# Patient Record
Sex: Female | Born: 1990 | Race: Black or African American | Hispanic: No | Marital: Single | Smoking: Current every day smoker
Health system: Southern US, Community
[De-identification: ages and names within clinical notes are randomized; demographics above are authoritative.]

## PROBLEM LIST (undated history)

## (undated) DIAGNOSIS — F419 Anxiety disorder, unspecified: Secondary | ICD-10-CM

## (undated) HISTORY — PX: WISDOM TOOTH EXTRACTION: SHX21

## (undated) HISTORY — PX: FINGER DEBRIDEMENT: SHX1634

## (undated) HISTORY — PX: BREAST CYST EXCISION: SHX579

## (undated) HISTORY — DX: Morbid (severe) obesity due to excess calories: E66.01

---

## 2015-07-25 ENCOUNTER — Encounter (HOSPITAL_COMMUNITY): Payer: Self-pay | Admitting: Emergency Medicine

## 2015-07-25 ENCOUNTER — Emergency Department (HOSPITAL_COMMUNITY)
Admission: EM | Admit: 2015-07-25 | Discharge: 2015-07-25 | Disposition: A | Discharge status: Home / Self Care | Payer: Self-pay | Attending: Emergency Medicine | Admitting: Emergency Medicine

## 2015-07-25 ENCOUNTER — Emergency Department (HOSPITAL_COMMUNITY): Payer: Self-pay

## 2015-07-25 DIAGNOSIS — J069 Acute upper respiratory infection, unspecified: Secondary | ICD-10-CM | POA: Insufficient documentation

## 2015-07-25 DIAGNOSIS — F17210 Nicotine dependence, cigarettes, uncomplicated: Secondary | ICD-10-CM | POA: Insufficient documentation

## 2015-07-25 DIAGNOSIS — K122 Cellulitis and abscess of mouth: Secondary | ICD-10-CM | POA: Insufficient documentation

## 2015-07-25 DIAGNOSIS — Z79899 Other long term (current) drug therapy: Secondary | ICD-10-CM | POA: Insufficient documentation

## 2015-07-25 MED ORDER — AMOXICILLIN 500 MG PO CAPS: 500.0000 mg | ORAL_CAPSULE | Freq: Three times a day (TID) | ORAL | Status: DC

## 2015-07-25 MED ORDER — ALBUTEROL SULFATE (2.5 MG/3ML) 0.083% IN NEBU
5.0000 mg | INHALATION_SOLUTION | Freq: Once | RESPIRATORY_TRACT | Status: AC
  Administered 2015-07-25: 5 mg via RESPIRATORY_TRACT
  Filled 2015-07-25: qty 6

## 2015-07-25 MED ORDER — IPRATROPIUM BROMIDE 0.02 % IN SOLN
0.5000 mg | Freq: Once | RESPIRATORY_TRACT | Status: AC
  Administered 2015-07-25: 0.5 mg via RESPIRATORY_TRACT
  Filled 2015-07-25: qty 2.5

## 2015-07-25 NOTE — ED Notes (Signed)
Patient reports having a cold for the last month.  Lists her symptoms as sinus congestion, wheezing, and coughing.  Patient in apparent distress at this time.

## 2015-07-25 NOTE — ED Provider Notes (Signed)
CSN: [id number]     Arrival date & time 07/25/15  0815 History   First MD Initiated Contact with Patient 07/25/15 0818     Chief Complaint  Patient presents with  . URI     (Consider location/radiation/quality/duration/timing/severity/associated sxs/prior Treatment) HPI   Patient to the ER with complaints of cold for 1 month. She has had sinus congestion, wheezing, coughing, current everyday smoker, with sore throat. Today she decided to come in because her mom recommended it when she was coughing more than normal and feels something swollen in her throat. She has also had hot flashes and cold chills. Per nursing note the patient is "in apparent distress at this time". However, she is resting comfortably in her bed without any sign of distress. NO SOB, drooling, angioedema, rash, respiratory distress. Pt talking in full sentences.   History reviewed. No pertinent past medical history. History reviewed. No pertinent past surgical history. No family history on file. Social History  Substance Use Topics  . Smoking status: Current Every Day Smoker -- 0.50 packs/day for 6 years    Types: Cigarettes  . Smokeless tobacco: None  . Alcohol Use: Yes     Comment: occasional   OB History    No data available     Review of Systems  ROS: See HPI Constitutional: no fever  Eyes: no drainage  ENT: no runny nose  Cardiovascular: no chest pain  Resp: no SOB  GI: no vomiting GU: no dysuria Integumentary: no rash  Allergy: no hives  Musculoskeletal: no leg swelling  Neurological: no slurred speech ROS otherwise negative  Allergies  Review of patient's allergies indicates no known allergies.  Home Medications   Prior to Admission medications   Medication Sig Start Date End Date Taking? Authorizing Provider  acetaminophen (TYLENOL) 500 MG tablet Take 1,000 mg by mouth every 6 (six) hours as needed for mild pain.    Yes Historical Provider, MD  Phenyleph-CPM-DM-APAP (TYLENOL  COLD MULTI-SYMPTOM) 12-29-08-325 MG MISC Take by mouth.   Yes Historical Provider, MD  amoxicillin (AMOXIL) 500 MG capsule Take 1 capsule (500 mg total) by mouth 3 (three) times daily. 07/25/15   Alyxander Kollmann Carlota Raspberry, PA-C   BP 103/82 mmHg  Pulse 106  Temp(Src) 98.9 F (37.2 C)  Resp 14  Ht 5\' 4"  (1.626 m)  Wt 104.327 kg  BMI 39.46 kg/m2  SpO2 100%  LMP 07/25/2015 Physical Exam  Constitutional: She appears well-developed and well-nourished. No distress.  HENT:  Head: Normocephalic and atraumatic.  Right Ear: Tympanic membrane and ear canal normal.  Left Ear: Tympanic membrane and ear canal normal.  Nose: Nose normal.  Mouth/Throat: Uvula is midline and oropharynx is clear and moist. No trismus in the jaw. Uvula swelling (erythema and swelling to uvula.) present. No dental abscesses.  Tongue or lip swelling.  Eyes: Pupils are equal, round, and reactive to light.  Neck: Normal range of motion. Neck supple.  Cardiovascular: Normal rate and regular rhythm.   Pulmonary/Chest: Effort normal. No accessory muscle usage. No tachypnea and no bradypnea. No respiratory distress. She has decreased breath sounds (to all lung fields). She has no wheezes. She has no rhonchi. She has no rales.  Abdominal: Soft. Bowel sounds are normal. There is no tenderness.  Neurological: She is alert.  Skin: Skin is warm and dry.  Psychiatric: Her mood appears not anxious. Her speech is not rapid and/or pressured. She does not exhibit a depressed mood.  Nursing note and vitals reviewed.   ED Course  Procedures (including critical care time) Labs Review Labs Reviewed - No data to display  Imaging Review Dg Neck Soft Tissue  07/25/2015  CLINICAL DATA:  Sore throat EXAM: NECK SOFT TISSUES - 1+ VIEW COMPARISON:  None. FINDINGS: There is no evidence of retropharyngeal soft tissue swelling or epiglottic enlargement. The cervical airway is unremarkable and no radio-opaque foreign body identified. IMPRESSION: Negative.  Electronically Signed   By: Franchot Gallo M.D.   On: 07/25/2015 09:06   Dg Chest 2 View  07/25/2015  CLINICAL DATA:  Sore throat, cough and congestion for 2 weeks. EXAM: CHEST  2 VIEW COMPARISON:  None. FINDINGS: Cardiomediastinal silhouette is normal. Mediastinal contours appear intact. There is no evidence of focal airspace consolidation, pleural effusion or pneumothorax. Osseous structures are without acute abnormality. Soft tissues are grossly normal. IMPRESSION: No active cardiopulmonary disease. Electronically Signed   By: Fidela Salisbury M.D.   On: 07/25/2015 09:06   I have personally reviewed and evaluated these images and lab results as part of my medical decision-making.   EKG Interpretation None      MDM   Final diagnoses:  Uvulitis  URI (upper respiratory infection)    Soft tissue neck and chest xrays are reassuring. Patient given a breathing treatment in the emergency department which helped open her airways. She will be given a prescription for amoxicillin for her uvulitis and a referral to ENT. Otherwise she can continue at home medication.  Medications  albuterol (PROVENTIL) (2.5 MG/3ML) 0.083% nebulizer solution 5 mg (5 mg Nebulization Given 07/25/15 0910)  ipratropium (ATROVENT) nebulizer solution 0.5 mg (0.5 mg Nebulization Given 07/25/15 0910)     I feel the patient has had an appropriate workup for their chief complaint at this time and likelihood of emergent condition existing is low. Discussed s/sx that warrant return to the ED.  Filed Vitals:   07/25/15 0823  BP: 103/82  Pulse: 106  Temp: 98.9 F (37.2 C)  Resp: [address] PA-C 07/25/15 [id number]  Dorie Rank, MD 07/25/15 [phone]

## 2015-07-25 NOTE — Discharge Instructions (Signed)
Uvulitis Uvulitis is infection or inflammation of the uvula. The uvula is the small, finger-like piece of tissue that hangs down at the back of your throat. CAUSES This condition may be caused by:  An infection in the mouth or throat. This is the most common cause.  Trauma to the uvula. Causes of trauma include burning your mouth and heavy snoring.  Fluid build-up (edema). Edema can be triggered be an allergic reaction. Uvulitis that is caused by edema is called Quincke disease.  Inhaling irritants, such as chemical agents, smoke, or steam. SYMPTOMS Symptoms of this condition depend on the cause.  Symptoms of uvulitis that is caused by infection include:  Red, swollen uvula.  Sore throat.  Fever.  Headache.  Swollen neck glands. Symptoms of uvulitis that is caused by trauma, edema, or irritation include:  Red, swollen uvula.  Sore throat.  Trouble swallowing.  Choking or gagging.  Trouble breathing. DIAGNOSIS This condition is diagnosed with a physical exam. You also may have tests, such as a throat culture and blood tests. TREATMENT Treatment for this condition depends on the cause. Treatment may involve:  Antibiotic medicine. Antibiotics may be prescribed if a bacterial infection is the cause.  Steroid medicine. Steroids may be given if edema is the cause.  Surgery to remove part of the uvula (partial uvulectomy). HOME CARE INSTRUCTIONS  Rest as much as possible until your condition improves.  Drink enough fluid to keep your urine clear or pale yellow.  Take over-the-counter and prescription medicines only as told by your health care provider.  If you were prescribed an antibiotic medicine, take it as told by your health care provider. Do not stop taking the antibiotic even if you start to feel better.  Use a cool-mist humidifier to ease irritation in your throat.  While your throat is sore:  Eat soft foods or drink liquids, such as soup.  Gargle with a  salt-water mixture 3-4 times per day or as needed. To make a salt-water mixture, completely dissolve -1 tsp of salt in 1 cup of warm water.  Keep all follow-up visits as told by your health care provider. This is important. SEEK MEDICAL CARE IF:  You have a fever.  You have trouble eating.  Your symptoms do not get better.  Your symptoms come back after treatment. SEEK IMMEDIATE MEDICAL CARE IF:  You have trouble breathing.  You have trouble swallowing.   This information is not intended to replace advice given to you by your health care provider. Make sure you discuss any questions you have with your health care provider.   Document Released: 03/26/2004 Document Revised: 05/07/2015 Document Reviewed: 11/06/2014 Elsevier Interactive Patient Education 2016 Else[employer]  Upper Respiratory Infection, Adult Most upper respiratory infections (URIs) are caused by a virus. A URI affects the nose, throat, and upper air passages. The most common type of URI is often called "the common cold." HOME CARE   Take medicines only as told by your doctor.  Gargle warm saltwater or take cough drops to comfort your throat as told by your doctor.  Use a warm mist humidifier or inhale steam from a shower to increase air moisture. This may make it easier to breathe.  Drink enough fluid to keep your pee (urine) clear or pale yellow.  Eat soups and other clear broths.  Have a healthy diet.  Rest as needed.  Go back to work when your fever is gone or your doctor says it is okay.  You may  need to stay home longer to avoid giving your URI to others.  You can also wear a face mask and wash your hands often to prevent spread of the virus.  Use your inhaler more if you have asthma.  Do not use any tobacco products, including cigarettes, chewing tobacco, or electronic cigarettes. If you need help quitting, ask your doctor. GET HELP IF:  You are getting worse, not better.  Your symptoms are  not helped by medicine.  You have chills.  You are getting more short of breath.  You have brown or red mucus.  You have yellow or brown discharge from your nose.  You have pain in your face, especially when you bend forward.  You have a fever.  You have puffy (swollen) neck glands.  You have pain while swallowing.  You have white areas in the back of your throat. GET HELP RIGHT AWAY IF:   You have very bad or constant:  Headache.  Ear pain.  Pain in your forehead, behind your eyes, and over your cheekbones (sinus pain).  Chest pain.  You have long-lasting (chronic) lung disease and any of the following:  Wheezing.  Long-lasting cough.  Coughing up blood.  A change in your usual mucus.  You have a stiff neck.  You have changes in your:  Vision.  Hearing.  Thinking.  Mood. MAKE SURE YOU:   Understand these instructions.  Will watch your condition.  Will get help right away if you are not doing well or get worse.   This information is not intended to replace advice given to you by your health care provider. Make sure you discuss any questions you have with your health care provider.   Document Released: 02/02/2008 Document Revised: 12/31/2014 Document Reviewed: 11/21/2013 Elsevier Interactive Patient Education [employer].

## 2015-10-01 ENCOUNTER — Emergency Department (HOSPITAL_COMMUNITY)
Admission: EM | Admit: 2015-10-01 | Discharge: 2015-10-01 | Disposition: A | Discharge status: Home / Self Care | Payer: Self-pay | Attending: Emergency Medicine | Admitting: Emergency Medicine

## 2015-10-01 ENCOUNTER — Encounter (HOSPITAL_COMMUNITY): Payer: Self-pay | Admitting: *Deleted

## 2015-10-01 DIAGNOSIS — M5432 Sciatica, left side: Secondary | ICD-10-CM | POA: Insufficient documentation

## 2015-10-01 DIAGNOSIS — Z792 Long term (current) use of antibiotics: Secondary | ICD-10-CM | POA: Insufficient documentation

## 2015-10-01 DIAGNOSIS — M62830 Muscle spasm of back: Secondary | ICD-10-CM | POA: Insufficient documentation

## 2015-10-01 DIAGNOSIS — F17210 Nicotine dependence, cigarettes, uncomplicated: Secondary | ICD-10-CM | POA: Insufficient documentation

## 2015-10-01 DIAGNOSIS — Z3202 Encounter for pregnancy test, result negative: Secondary | ICD-10-CM | POA: Insufficient documentation

## 2015-10-01 LAB — URINALYSIS, ROUTINE W REFLEX MICROSCOPIC
Bilirubin Urine: NEGATIVE
Glucose, UA: NEGATIVE mg/dL
Hgb urine dipstick: NEGATIVE
KETONES UR: NEGATIVE mg/dL
NITRITE: NEGATIVE
PH: 6 (ref 5.0–8.0)
Protein, ur: NEGATIVE mg/dL
SPECIFIC GRAVITY, URINE: 1.018 (ref 1.005–1.030)

## 2015-10-01 LAB — URINE MICROSCOPIC-ADD ON: RBC / HPF: NONE SEEN RBC/hpf (ref 0–5)

## 2015-10-01 LAB — POC URINE PREG, ED: PREG TEST UR: NEGATIVE

## 2015-10-01 MED ORDER — NAPROXEN 500 MG PO TABS: 500.0000 mg | ORAL_TABLET | Freq: Two times a day (BID) | ORAL | Status: DC

## 2015-10-01 MED ORDER — CYCLOBENZAPRINE HCL 10 MG PO TABS: 10.0000 mg | ORAL_TABLET | Freq: Two times a day (BID) | ORAL | Status: DC | PRN

## 2015-10-01 MED ORDER — PREDNISONE 10 MG PO TABS: 20.0000 mg | ORAL_TABLET | Freq: Two times a day (BID) | ORAL | Status: DC

## 2015-10-01 NOTE — ED Notes (Signed)
thge pt is c/o lt flank pain since yesterday .  She has also had some painful urination no blood in her urine lmp last month

## 2015-10-01 NOTE — ED Provider Notes (Signed)
CSN: [id number]     Arrival date & time 10/01/15  2050 History  By signing my name below, I, Starleen Arms, attest that this documentation has been prepared under the direction and in the presence of Debroah Baller, NP. Electronically Signed: Starleen Arms ED Scribe. 10/01/2015. 9:19 PM.    Chief Complaint  Patient presents with  . Flank Pain   Patient is a 25 y.o. female presenting with flank pain. The history is provided by the patient. No language interpreter was used.  Flank Pain This is a new problem. The current episode started yesterday. The problem occurs constantly. The problem has been gradually worsening. Nothing aggravates the symptoms. Nothing relieves the symptoms. She has tried nothing for the symptoms. The treatment provided no relief.   HPI Comments: Ana Watson is a 25 y.o. female who presents to the Emergency Department complaining of sudden onset, constant, gradually worsening left lower back pain onset yesterday while walking.  The pain worsens to a 10/10 and radiates to the left buttock with movement of the left leg.  The pain is also worse wrse with bending and squatting.  The patient has tried stretches but no other treatments.  She also notes increased urinary frequency.  She denies dysuria, fever, chills.   History reviewed. No pertinent past medical history. History reviewed. No pertinent past surgical history. No family history on file. Social History  Substance Use Topics  . Smoking status: Current Every Day Smoker -- 0.50 packs/day for 6 years    Types: Cigarettes  . Smokeless tobacco: None  . Alcohol Use: Yes     Comment: occasional   OB History    No data available     Review of Systems  Musculoskeletal: Positive for back pain.   A complete 10 system review of systems was obtained and all systems are negative except as noted in the HPI and PMH.    Allergies  Review of patient's allergies indicates no known allergies.  Home Medications   Prior to  Admission medications   Medication Sig Start Date End Date Taking? Authorizing Provider  acetaminophen (TYLENOL) 500 MG tablet Take 1,000 mg by mouth every 6 (six) hours as needed for mild pain.     Historical Provider, MD  amoxicillin (AMOXIL) 500 MG capsule Take 1 capsule (500 mg total) by mouth 3 (three) times daily. 07/25/15   Tiffany Carlota Raspberry, PA-C  cyclobenzaprine (FLEXERIL) 10 MG tablet Take 1 tablet (10 mg total) by mouth 2 (two) times daily as needed for muscle spasms. 10/01/15   [place], NP  naproxen (NAPROSYN) 500 MG tablet Take 1 tablet (500 mg total) by mouth 2 (two) times daily. 10/01/15   [place], NP  Phenyleph-CPM-DM-APAP (TYLENOL COLD MULTI-SYMPTOM) 12-29-08-325 MG MISC Take by mouth.    Historical Provider, MD  predniSONE (DELTASONE) 10 MG tablet Take 2 tablets (20 mg total) by mouth 2 (two) times daily with a meal. 10/01/15   Vontrell Pullman Bunnie Pion, NP   BP 143/71 mmHg  Pulse 92  Temp(Src) 98.6 F (37 C) (Oral)  Resp 16  SpO2 100% Physical Exam  Constitutional: She is oriented to person, place, and time. She appears well-developed and well-nourished. No distress.  HENT:  Head: Normocephalic and atraumatic.  Right Ear: Tympanic membrane normal.  Left Ear: Tympanic membrane normal.  Nose: Nose normal.  Mouth/Throat: Uvula is midline, oropharynx is clear and moist and mucous membranes are normal.  Eyes: Conjunctivae and EOM are normal.  Neck: Normal range of motion.  Neck supple. No tracheal deviation present.  Cardiovascular: Normal rate and regular rhythm.   Pulmonary/Chest: Effort normal. No respiratory distress. She has no wheezes. She has no rales.  Abdominal: Soft. Bowel sounds are normal. There is no tenderness.  Genitourinary:  No CVA tenderness.   Musculoskeletal: Normal range of motion.       Lumbar back: She exhibits tenderness and spasm. She exhibits normal pulse. Decreased range of motion: due to pain.  Tender over left sciatic nerve.  Pain radiates from left lower  lumbar area to left sciatic nerve.    Neurological: She is alert and oriented to person, place, and time. She has normal strength. No cranial nerve deficit or sensory deficit. Gait normal.  Reflex Scores:      Bicep reflexes are 2+ on the right side and 2+ on the left side.      Brachioradialis reflexes are 2+ on the right side and 2+ on the left side.      Patellar reflexes are 2+ on the right side and 2+ on the left side. Skin: Skin is warm and dry.  Psychiatric: She has a normal mood and affect. Her behavior is normal.  Nursing note and vitals reviewed.   ED Course  Procedures (including critical care time)  DIAGNOSTIC STUDIES: Oxygen Saturation is 100% on RA, normal by my interpretation.    COORDINATION OF CARE:  9:21 PM Discussed suspicion of sciatica.  Will order labs to r/o other etiology.  Patient acknowledges and agrees with plan.    Labs Review Results for orders placed or performed during the hospital encounter of 10/01/15 (from the past 24 hour(s))  Urinalysis, Routine w reflex microscopic (not at [facility])     Status: Abnormal   Collection Time: 10/01/15  9:13 PM  Result Value Ref Range   Color, Urine YELLOW YELLOW   APPearance CLOUDY (A) CLEAR   Specific Gravity, Urine 1.018 1.005 - 1.030   pH 6.0 5.0 - 8.0   Glucose, UA NEGATIVE NEGATIVE mg/dL   Hgb urine dipstick NEGATIVE NEGATIVE   Bilirubin Urine NEGATIVE NEGATIVE   Ketones, ur NEGATIVE NEGATIVE mg/dL   Protein, ur NEGATIVE NEGATIVE mg/dL   Nitrite NEGATIVE NEGATIVE   Leukocytes, UA SMALL (A) NEGATIVE  POC Urine Pregnancy, ED (do NOT order at [facility])     Status: None   Collection Time: 10/01/15  9:13 PM  Result Value Ref Range   Preg Test, Ur NEGATIVE NEGATIVE  Urine microscopic-add on     Status: Abnormal   Collection Time: 10/01/15  9:13 PM  Result Value Ref Range   Squamous Epithelial / LPF 0-5 (A) NONE SEEN   WBC, UA 0-5 0 - 5 WBC/hpf   RBC / HPF NONE SEEN 0 - 5 RBC/hpf   Bacteria, UA FEW (A) NONE SEEN    Urine-Other AMORPHOUS URATES/PHOSPHATES       MDM  25 y.o. female with low back pain that radiates to the left buttocks stable for d/c with normal urine, no focal neuro deficits and no red flags to indicate need for immediate neuro consult.  Discussed with the patient and all questioned fully answered. She will return for any problems.will treat for pain and muscle spasm. She will follow up with ortho if symptoms persist.  Final diagnoses:  Sciatica, left    I personally performed the services described in this documentation, which was scribed in my presence. The recorded information has been reviewed and is accurate.    [facility] Bunnie Pion, NP 10/02/15  [place], MD 10/05/15 [phone]

## 2016-08-30 ENCOUNTER — Emergency Department (HOSPITAL_COMMUNITY)
Admission: EM | Admit: 2016-08-30 | Discharge: 2016-08-30 | Disposition: A | Discharge status: Home / Self Care | Payer: Self-pay | Attending: Emergency Medicine | Admitting: Emergency Medicine

## 2016-08-30 ENCOUNTER — Encounter (HOSPITAL_COMMUNITY): Payer: Self-pay | Admitting: *Deleted

## 2016-08-30 DIAGNOSIS — K0889 Other specified disorders of teeth and supporting structures: Secondary | ICD-10-CM | POA: Insufficient documentation

## 2016-08-30 DIAGNOSIS — F17210 Nicotine dependence, cigarettes, uncomplicated: Secondary | ICD-10-CM | POA: Insufficient documentation

## 2016-08-30 MED ORDER — PENICILLIN V POTASSIUM 500 MG PO TABS: 500.0000 mg | ORAL_TABLET | Freq: Four times a day (QID) | ORAL | 0 refills | Status: AC

## 2016-08-30 MED ORDER — OXYCODONE-ACETAMINOPHEN 5-325 MG PO TABS: 2.0000 | ORAL_TABLET | ORAL | 0 refills | Status: DC | PRN

## 2016-08-30 NOTE — ED Triage Notes (Signed)
The pt has had a toothache for 3 days with  Rt ear pain  lmp dec 25

## 2016-08-30 NOTE — ED Provider Notes (Signed)
[facility] DEPT Provider Note   CSN: [id number] Arrival date & time: 08/30/16  [id number]   By signing my name below, I, Delton Prairie, attest that this documentation has been prepared under the direction and in the presence of  [employer], PA-C. Electronically Signed: Delton Prairie, ED Scribe. 08/30/16. 6:08 PM.  History   Chief Complaint Chief Complaint  Patient presents with  . Dental Pain   The history is provided by the patient. No language interpreter was used.   HPI Comments:  Ana Watson is a 26 y.o. female who presents to the Emergency Department complaining of sudden onset, moderate right, upper dental pain x 3 days. Pt states her tooth has been broken for a while now. She also reports right ear pain, right sided facial swelling and right sided facial pain. Her pain is worse upon palpation. No alleviating factors noted. Pt denies fevers, any other associated symptoms and any other modifying factors at this time. Pt is not followed by a dentist.    History reviewed. No pertinent past medical history.  There are no active problems to display for this patient.   History reviewed. No pertinent surgical history.  OB History    No data available       Home Medications    Prior to Admission medications   Medication Sig Start Date End Date Taking? Authorizing Provider  acetaminophen (TYLENOL) 500 MG tablet Take 1,000 mg by mouth every 6 (six) hours as needed for mild pain.     Historical Provider, MD  amoxicillin (AMOXIL) 500 MG capsule Take 1 capsule (500 mg total) by mouth 3 (three) times daily. 07/25/15   Tiffany Carlota Raspberry, PA-C  cyclobenzaprine (FLEXERIL) 10 MG tablet Take 1 tablet (10 mg total) by mouth 2 (two) times daily as needed for muscle spasms. 10/01/15   [place], NP  naproxen (NAPROSYN) 500 MG tablet Take 1 tablet (500 mg total) by mouth 2 (two) times daily. 10/01/15   [place], NP  oxyCODONE-acetaminophen (PERCOCET/ROXICET) 5-325 MG tablet Take 2  tablets by mouth every 4 (four) hours as needed for severe pain. 08/30/16   Okey Regal, PA-C  penicillin v potassium (VEETID) 500 MG tablet Take 1 tablet (500 mg total) by mouth 4 (four) times daily. 08/30/16 09/06/16  Dellis Filbert Abraham Margulies, PA-C  Phenyleph-CPM-DM-APAP (TYLENOL COLD MULTI-SYMPTOM) 12-29-08-325 MG MISC Take by mouth.    Historical Provider, MD  predniSONE (DELTASONE) 10 MG tablet Take 2 tablets (20 mg total) by mouth 2 (two) times daily with a meal. 10/01/15   Hope Bunnie Pion, NP    Family History No family history on file.  Social History Social History  Substance Use Topics  . Smoking status: Current Every Day Smoker    Packs/day: 0.50    Years: 6.00    Types: Cigarettes  . Smokeless tobacco: Never Used  . Alcohol use Yes     Comment: occasional     Allergies   Patient has no known allergies.   Review of Systems Review of Systems 10 systems reviewed and all are negative for acute change except as noted in the HPI.   Physical Exam Updated Vital Signs BP 131/72 (BP Location: Right Arm)   Pulse 70   Temp 98.1 F (36.7 C) (Oral)   Resp 16   Ht 5\' 4"  (1.626 m)   Wt 112.3 kg   LMP 08/22/2016   SpO2 100%   BMI 42.48 kg/m   Physical Exam  Constitutional: She is oriented to person, place,  and time. She appears well-developed and well-nourished. No distress.  HENT:  Head: Normocephalic.  Mouth/Throat: Uvula is midline, oropharynx is clear and moist and mucous membranes are normal. No oropharyngeal exudate, posterior oropharyngeal edema, posterior oropharyngeal erythema or tonsillar abscesses.  External exam shows no asymmetry of the jaw line or face, no signs of obvious swelling, edema, infection. Full active range of motion of the jaw. Neck is supple with full active range of motion, no tenderness to palpation of the soft tissues. Significant dental carries over second upper molar on right with fractured tooth.  Gumline palpated no obvious signs of infection including  warmth, redness, abscess, tenderness. Posterior oropharynx clear with no signs of infection, uvula is midline and rises with phonation, tonsils present and normal in size, symmetrical bilateral, tongue is normal soft touch with full active range of motion, floor mouth is soft nontender.  Eyes: Conjunctivae are normal. Pupils are equal, round, and reactive to light. Right eye exhibits no discharge. Left eye exhibits no discharge.  Neck: Normal range of motion. Neck supple. No JVD present. No tracheal deviation present. No thyromegaly present.  Pulmonary/Chest: No stridor.  Lymphadenopathy:    She has no cervical adenopathy.  Neurological: She is alert and oriented to person, place, and time.  Skin: Skin is warm and dry. No rash noted. She is not diaphoretic. No erythema. No pallor.  Psychiatric: She has a normal mood and affect. Her behavior is normal. Judgment and thought content normal.  Nursing note and vitals reviewed.   ED Treatments / Results  DIAGNOSTIC STUDIES:  Oxygen Saturation is 100% on RA, normal by my interpretation.    COORDINATION OF CARE:  6:03 PM Discussed treatment plan with pt at bedside and pt agreed to plan.  Labs (all labs ordered are listed, but only abnormal results are displayed) Labs Reviewed - No data to display  EKG  EKG Interpretation None       Radiology No results found.  Procedures Procedures (including critical care time)  Medications Ordered in ED Medications - No data to display   Initial Impression / Assessment and Plan / ED Course  I have reviewed the triage vital signs and the nursing notes.  Pertinent labs & imaging results that were available during my care of the patient were reviewed by me and considered in my medical decision making (see chart for details).  Clinical Course     Labs:   Imaging:   Consults:   Therapeutics:   Discharge Meds:  Assessment/Plan: 26 year old female presents today with uncomplicated  dental pain. Patient reports infectious symptoms, none noted on my exam. Patient will be treated with penicillin, and a short course of pain medication until she is able to follow up with on-call dentist. I informed her that she would need to follow up with dentist as ED cannot provide definitive resources for dental management. She verbalized her understanding and agreement to today's plan had no further questions concerns at time of discharge  Final Clinical Impressions(s) / ED Diagnoses   Final diagnoses:  Pain, dental    New Prescriptions Discharge Medication List as of 08/30/2016  6:09 PM    START taking these medications   Details  oxyCODONE-acetaminophen (PERCOCET/ROXICET) 5-325 MG tablet Take 2 tablets by mouth every 4 (four) hours as needed for severe pain., Starting Mon 08/30/2016, Print    penicillin v potassium (VEETID) 500 MG tablet Take 1 tablet (500 mg total) by mouth 4 (four) times daily., Starting Mon 08/30/2016, Until Mon 09/06/2016,  Print      I personally performed the services described in this documentation, which was scribed in my presence. The recorded information has been reviewed and is accurate.   Okey Regal, PA-C 08/30/16 1848    Davonna Belling, MD 08/31/16 [phone]

## 2016-08-30 NOTE — Discharge Instructions (Signed)
Please read attached information. If you experience any new or worsening signs or symptoms please return to the emergency room for evaluation. Please follow-up with your primary care provider or specialist as discussed. Please use medication prescribed only as directed and discontinue taking if you have any concerning signs or symptoms.   °

## 2016-08-30 NOTE — ED Notes (Signed)
Pt c/o right sided tooth pain and ear ache worsening as of last Friday. Pt denies drainage but states she had two boils that ruptured from tooth.

## 2016-08-30 NOTE — ED Notes (Signed)
Pt stable, understands discharge instructions, and reasons for return.   

## 2018-06-09 ENCOUNTER — Emergency Department (HOSPITAL_COMMUNITY): Payer: No Typology Code available for payment source

## 2018-06-09 ENCOUNTER — Encounter (HOSPITAL_COMMUNITY): Payer: Self-pay

## 2018-06-09 ENCOUNTER — Other Ambulatory Visit: Payer: Self-pay

## 2018-06-09 ENCOUNTER — Emergency Department (HOSPITAL_COMMUNITY)
Admission: EM | Admit: 2018-06-09 | Discharge: 2018-06-09 | Disposition: A | Discharge status: Home / Self Care | Payer: No Typology Code available for payment source | Attending: Emergency Medicine | Admitting: Emergency Medicine

## 2018-06-09 DIAGNOSIS — F17210 Nicotine dependence, cigarettes, uncomplicated: Secondary | ICD-10-CM | POA: Diagnosis not present

## 2018-06-09 DIAGNOSIS — Y939 Activity, unspecified: Secondary | ICD-10-CM | POA: Insufficient documentation

## 2018-06-09 DIAGNOSIS — S8002XA Contusion of left knee, initial encounter: Secondary | ICD-10-CM | POA: Diagnosis not present

## 2018-06-09 DIAGNOSIS — Y929 Unspecified place or not applicable: Secondary | ICD-10-CM | POA: Insufficient documentation

## 2018-06-09 DIAGNOSIS — Y999 Unspecified external cause status: Secondary | ICD-10-CM | POA: Diagnosis not present

## 2018-06-09 DIAGNOSIS — Z79899 Other long term (current) drug therapy: Secondary | ICD-10-CM | POA: Diagnosis not present

## 2018-06-09 DIAGNOSIS — S39012A Strain of muscle, fascia and tendon of lower back, initial encounter: Secondary | ICD-10-CM | POA: Insufficient documentation

## 2018-06-09 DIAGNOSIS — S161XXA Strain of muscle, fascia and tendon at neck level, initial encounter: Secondary | ICD-10-CM | POA: Diagnosis present

## 2018-06-09 LAB — POC URINE PREG, ED: Preg Test, Ur: NEGATIVE

## 2018-06-09 MED ORDER — CYCLOBENZAPRINE HCL 10 MG PO TABS: 10.0000 mg | ORAL_TABLET | Freq: Three times a day (TID) | ORAL | 0 refills | Status: DC | PRN

## 2018-06-09 MED ORDER — ACETAMINOPHEN 500 MG PO TABS
1000.0000 mg | ORAL_TABLET | Freq: Once | ORAL | Status: AC
  Administered 2018-06-09: 1000 mg via ORAL
  Filled 2018-06-09: qty 2

## 2018-06-09 MED ORDER — TRAMADOL HCL 50 MG PO TABS: 50.0000 mg | ORAL_TABLET | Freq: Four times a day (QID) | ORAL | 0 refills | Status: DC | PRN
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
## 2018-06-09 NOTE — ED Notes (Signed)
Patient transported to X-ray 
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
## 2018-10-14 ENCOUNTER — Encounter (HOSPITAL_COMMUNITY): Payer: Self-pay

## 2018-10-14 ENCOUNTER — Emergency Department (HOSPITAL_COMMUNITY)
Admission: EM | Admit: 2018-10-14 | Discharge: 2018-10-14 | Disposition: A | Discharge status: Home / Self Care | Payer: Self-pay | Attending: Emergency Medicine | Admitting: Emergency Medicine

## 2018-10-14 ENCOUNTER — Other Ambulatory Visit: Payer: Self-pay

## 2018-10-14 DIAGNOSIS — Z79899 Other long term (current) drug therapy: Secondary | ICD-10-CM | POA: Insufficient documentation

## 2018-10-14 DIAGNOSIS — J101 Influenza due to other identified influenza virus with other respiratory manifestations: Secondary | ICD-10-CM | POA: Insufficient documentation

## 2018-10-14 DIAGNOSIS — F17210 Nicotine dependence, cigarettes, uncomplicated: Secondary | ICD-10-CM | POA: Insufficient documentation

## 2018-10-14 DIAGNOSIS — B353 Tinea pedis: Secondary | ICD-10-CM | POA: Insufficient documentation

## 2018-10-14 LAB — INFLUENZA PANEL BY PCR (TYPE A & B)
INFLAPCR: POSITIVE — AB
Influenza B By PCR: NEGATIVE

## 2018-10-14 MED ORDER — IPRATROPIUM-ALBUTEROL 0.5-2.5 (3) MG/3ML IN SOLN
3.0000 mL | Freq: Once | RESPIRATORY_TRACT | Status: AC
  Administered 2018-10-14: 3 mL via RESPIRATORY_TRACT
  Filled 2018-10-14: qty 3

## 2018-10-14 MED ORDER — OSELTAMIVIR PHOSPHATE 75 MG PO CAPS
75.0000 mg | ORAL_CAPSULE | Freq: Two times a day (BID) | ORAL | 0 refills | Status: DC
Start: 2018-10-14 — End: 2021-02-16

## 2018-10-14 MED ORDER — ONDANSETRON 4 MG PO TBDP
4.0000 mg | ORAL_TABLET | Freq: Once | ORAL | Status: AC
  Administered 2018-10-14: 4 mg via ORAL
  Filled 2018-10-14: qty 1

## 2018-10-14 MED ORDER — PROMETHAZINE-PHENYLEPHRINE 6.25-5 MG/5ML PO SYRP: 5.0000 mL | ORAL_SOLUTION | Freq: Four times a day (QID) | ORAL | 0 refills | Status: DC | PRN

## 2018-10-14 MED ORDER — IBUPROFEN 200 MG PO TABS
600.0000 mg | ORAL_TABLET | Freq: Once | ORAL | Status: AC
  Administered 2018-10-14: 600 mg via ORAL
  Filled 2018-10-14: qty 3

## 2018-10-14 MED ORDER — HYDROCOD POLST-CPM POLST ER 10-8 MG/5ML PO SUER
5.0000 mL | Freq: Once | ORAL | Status: AC
  Administered 2018-10-14: 5 mL via ORAL
  Filled 2018-10-14: qty 5

## 2018-10-14 MED ORDER — OSELTAMIVIR PHOSPHATE 75 MG PO CAPS
75.0000 mg | ORAL_CAPSULE | Freq: Once | ORAL | Status: AC
  Administered 2018-10-14: 75 mg via ORAL
  Filled 2018-10-14: qty 1

## 2018-10-14 NOTE — ED Triage Notes (Signed)
Pt reports she started having a cough yesterday and woke up this morning feeling worse with chills, runny nose, and fever or 102. Pt denies taking anything for fever at home. Pt also reports "infection" of right foot. Pt has wound and swelling between toes on right foot.

## 2018-10-14 NOTE — Discharge Instructions (Signed)
Take tylenol and motrin as needed for fever, drink plenty of fluids so you do not get dehydrated. Do not drive while taking the cough medication as it will make you sleepy. Follow up with your doctor or return here for worsening symptoms.

## 2018-10-14 NOTE — ED Provider Notes (Signed)
[facility] DEPT Provider Note   CSN: [id number] Arrival date & time: 10/14/18  1918     History   Chief Complaint Chief Complaint  Patient presents with  . Fever  . Cough  . Foot Injury    HPI Ana Watson is a 28 y.o. female who presents to the ED with fever, chills, body aches, sore throat and cough that started yesterday and fever today. Patient also reports that her 4th and 5th toes are wet between them and have a smell. She has used antifungal spray that has helped some.   The history is provided by the patient. No language interpreter was used.  Fever  Max temp prior to arrival:  103 Temp source:  Oral Severity:  Moderate Onset quality:  Gradual Duration:  2 days Timing:  Intermittent Progression:  Worsening Associated symptoms: congestion, cough, ear pain, headaches, myalgias, rhinorrhea and sore throat   Associated symptoms: no chest pain (with cough only), no confusion, no diarrhea, no dysuria, no nausea, no rash and no vomiting   Cough  Associated symptoms: ear pain, fever, headaches, myalgias, rhinorrhea and sore throat   Associated symptoms: no chest pain (with cough only), no eye discharge, no rash and no shortness of breath   Foot Injury  Associated symptoms: fatigue and fever   Influenza  Presenting symptoms: cough, fatigue, fever, headache, myalgias, rhinorrhea and sore throat   Presenting symptoms: no diarrhea, no nausea, no shortness of breath and no vomiting   Associated symptoms: ear pain and nasal congestion     History reviewed. No pertinent past medical history.  There are no active problems to display for this patient.   History reviewed. No pertinent surgical history.   OB History   No obstetric history on file.      Home Medications    Prior to Admission medications   Medication Sig Start Date End Date Taking? Authorizing Provider  acetaminophen (TYLENOL) 500 MG tablet Take 1,000 mg by mouth  every 6 (six) hours as needed for mild pain.     [provider]  amoxicillin (AMOXIL) 500 MG capsule Take 1 capsule (500 mg total) by mouth 3 (three) times daily. 07/25/15   Delos Haring, PA-C  cyclobenzaprine (FLEXERIL) 10 MG tablet Take 1 tablet (10 mg total) by mouth 3 (three) times daily as needed for muscle spasms. 06/09/18   Orpah Greek, MD  naproxen (NAPROSYN) 500 MG tablet Take 1 tablet (500 mg total) by mouth 2 (two) times daily. 10/01/15   Ashley Murrain, NP  oseltamivir (TAMIFLU) 75 MG capsule Take 1 capsule (75 mg total) by mouth every 12 (twelve) hours. 10/14/18   Ashley Murrain, NP  oxyCODONE-acetaminophen (PERCOCET/ROXICET) 5-325 MG tablet Take 2 tablets by mouth every 4 (four) hours as needed for severe pain. 08/30/16   Hedges, Dellis Filbert, PA-C  Phenyleph-CPM-DM-APAP (TYLENOL COLD MULTI-SYMPTOM) 12-29-08-325 MG MISC Take by mouth.    [provider]  predniSONE (DELTASONE) 10 MG tablet Take 2 tablets (20 mg total) by mouth 2 (two) times daily with a meal. 10/01/15   Xyla Leisner, [place], NP  promethazine-phenylephrine (PROMETHAZINE VC) 6.25-5 MG/5ML SYRP Take 5 mLs by mouth every 6 (six) hours as needed for congestion. 10/14/18   Ashley Murrain, NP  traMADol (ULTRAM) 50 MG tablet Take 1 tablet (50 mg total) by mouth every 6 (six) hours as needed. 06/09/18   Orpah Greek, MD    Family History History reviewed. No pertinent family history.  Social  History Social History   Tobacco Use  . Smoking status: Current Every Day Smoker    Packs/day: 0.50    Years: 6.00    Pack years: 3.00    Types: Cigarettes  . Smokeless tobacco: Never Used  Substance Use Topics  . Alcohol use: Yes    Comment: occasional  . Drug use: No     Allergies   Patient has no known allergies.   Review of Systems Review of Systems  Constitutional: Positive for fatigue and fever.  HENT: Positive for congestion, ear pain, rhinorrhea and sore throat. Negative for trouble  swallowing.   Eyes: Positive for itching. Negative for discharge and redness.  Respiratory: Positive for cough. Negative for shortness of breath.   Cardiovascular: Negative for chest pain (with cough only).  Gastrointestinal: Negative for abdominal pain, diarrhea, nausea and vomiting.  Genitourinary: Negative for dysuria, frequency and urgency.  Musculoskeletal: Positive for myalgias.       Right foot with rash between toes.   Skin: Negative for rash and wound.  Neurological: Positive for headaches.  Psychiatric/Behavioral: Negative for confusion.     Physical Exam Updated Vital Signs BP 131/82 (BP Location: Right Arm)   Pulse (!) 103   Temp 100 F (37.8 C) (Oral)   Resp 18   SpO2 98%   Physical Exam Vitals signs and nursing note reviewed.  Constitutional:      General: She is not in acute distress.    Appearance: She is well-developed.  HENT:     Head: Normocephalic.     Right Ear: Tympanic membrane normal.     Left Ear: Tympanic membrane normal.     Nose: Congestion present.     Mouth/Throat:     Mouth: Mucous membranes are moist.     Pharynx: No posterior oropharyngeal erythema.  Eyes:     Extraocular Movements: Extraocular movements intact.     Conjunctiva/sclera: Conjunctivae normal.  Neck:     Musculoskeletal: Normal range of motion and neck supple. No neck rigidity.  Cardiovascular:     Rate and Rhythm: Regular rhythm. Tachycardia present.  Pulmonary:     Effort: Pulmonary effort is normal. No respiratory distress.     Breath sounds: Decreased air movement present.  Abdominal:     Palpations: Abdomen is soft.     Tenderness: There is no abdominal tenderness.  Musculoskeletal: Normal range of motion.     Right foot: Normal range of motion and normal capillary refill. Tenderness present. No deformity or laceration.       Feet:     Comments: Moist malodorous areas in the web space of the 4th and 5th right toes.   Skin:    General: Skin is warm and dry.    Neurological:     Mental Status: She is alert and oriented to person, place, and time.     Cranial Nerves: No cranial nerve deficit.  Psychiatric:        Mood and Affect: Mood normal.      ED Treatments / Results  Labs (all labs ordered are listed, but only abnormal results are displayed) Labs Reviewed  INFLUENZA PANEL BY PCR (TYPE A & B) - Abnormal; Notable for the following components:      Result Value   Influenza A By PCR POSITIVE (*)    All other components within normal limits    Radiology No results found.  Procedures Procedures (including critical care time)  Medications Ordered in ED Medications  ibuprofen (ADVIL,MOTRIN) tablet 600  mg (600 mg Oral Given 10/14/18 2026)  ipratropium-albuterol (DUONEB) 0.5-2.5 (3) MG/3ML nebulizer solution 3 mL (3 mLs Nebulization Given 10/14/18 2130)   Patient reports some improvement with neb treatment. She reports feeling a lot better after the ibuprofen and fever going down.   Initial Impression / Assessment and Plan / ED Course  I have reviewed the triage vital signs and the nursing notes.  SUBJECTIVE:  Lakashia Collison is a 28 y.o. female who present complaining of flu-like symptoms: fevers, chills, myalgias, congestion, sore throat and cough for 2 days. Denies dyspnea or wheezing.  OBJECTIVE: Appears moderately ill but not toxic; temperature as noted in vitals. Ears normal. Throat and pharynx normal.  Neck supple. No adenopathy in the neck. Sinuses non tender. The chest is clear.  ASSESSMENT: Influenza A  PLAN: Symptomatic therapy suggested: rest, increase fluids, gargle prn for sore throat and use mist of vaporizer prn. Follow up with PCP or return here for worsening symptoms.  Patient will continue to treat the fungal infection between her toes with the OTC antifungal medication.   Final Clinical Impressions(s) / ED Diagnoses   Final diagnoses:  Tinea pedis of right foot  Influenza A    ED Discharge Orders          Ordered    oseltamivir (TAMIFLU) 75 MG capsule  Every 12 hours     10/14/18 2148    promethazine-phenylephrine (PROMETHAZINE VC) 6.25-5 MG/5ML SYRP  Every 6 hours PRN     10/14/18 2148           Debroah Baller [place], NP 10/14/18 2200    Virgel Manifold, MD 10/15/18 [phone]

## 2018-10-17 ENCOUNTER — Telehealth (HOSPITAL_COMMUNITY): Payer: Self-pay | Admitting: Emergency Medicine

## 2018-10-17 MED ORDER — PROMETHAZINE-DM 6.25-15 MG/5ML PO SYRP: 5.0000 mL | ORAL_SOLUTION | Freq: Four times a day (QID) | ORAL | 0 refills | Status: DC | PRN

## 2018-10-17 NOTE — Telephone Encounter (Signed)
Patient's pharmacy that the prescription was sent to does not have this medication.

## 2019-07-18 ENCOUNTER — Emergency Department (HOSPITAL_COMMUNITY): Payer: No Typology Code available for payment source

## 2019-07-18 ENCOUNTER — Emergency Department (HOSPITAL_COMMUNITY)
Admission: EM | Admit: 2019-07-18 | Discharge: 2019-07-18 | Disposition: A | Discharge status: Home / Self Care | Payer: No Typology Code available for payment source | Attending: Emergency Medicine | Admitting: Emergency Medicine

## 2019-07-18 ENCOUNTER — Encounter (HOSPITAL_COMMUNITY): Payer: Self-pay

## 2019-07-18 ENCOUNTER — Other Ambulatory Visit: Payer: Self-pay

## 2019-07-18 DIAGNOSIS — F17210 Nicotine dependence, cigarettes, uncomplicated: Secondary | ICD-10-CM | POA: Insufficient documentation

## 2019-07-18 DIAGNOSIS — R519 Headache, unspecified: Secondary | ICD-10-CM | POA: Diagnosis not present

## 2019-07-18 DIAGNOSIS — M545 Low back pain: Secondary | ICD-10-CM | POA: Diagnosis not present

## 2019-07-18 DIAGNOSIS — M25551 Pain in right hip: Secondary | ICD-10-CM | POA: Diagnosis not present

## 2019-07-18 LAB — I-STAT BETA HCG BLOOD, ED (MC, WL, AP ONLY): I-stat hCG, quantitative: <5 m[IU]/mL (ref <5)

## 2019-07-18 NOTE — ED Notes (Signed)
Patient transported to CT 
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
## 2019-07-18 NOTE — Discharge Instructions (Signed)
Please return to ER if you develop chest pain, difficulty breathing, vomiting or other new concerns.  Recommend talking with his knee pain control.
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
## 2020-01-07 ENCOUNTER — Other Ambulatory Visit: Payer: Self-pay | Admitting: Specialist

## 2020-01-07 DIAGNOSIS — S060X0A Concussion without loss of consciousness, initial encounter: Secondary | ICD-10-CM

## 2020-02-04 ENCOUNTER — Other Ambulatory Visit: Payer: Self-pay

## 2020-03-05 ENCOUNTER — Other Ambulatory Visit: Payer: Self-pay

## 2021-02-16 ENCOUNTER — Other Ambulatory Visit: Payer: Self-pay

## 2021-02-16 ENCOUNTER — Encounter (HOSPITAL_COMMUNITY): Payer: Self-pay | Admitting: Emergency Medicine

## 2021-02-16 ENCOUNTER — Ambulatory Visit (HOSPITAL_COMMUNITY)
Admission: EM | Admit: 2021-02-16 | Discharge: 2021-02-16 | Disposition: A | Discharge status: Home / Self Care | Payer: Self-pay | Attending: Urgent Care | Admitting: Urgent Care

## 2021-02-16 ENCOUNTER — Ambulatory Visit (INDEPENDENT_AMBULATORY_CARE_PROVIDER_SITE_OTHER): Payer: Self-pay

## 2021-02-16 DIAGNOSIS — R0789 Other chest pain: Secondary | ICD-10-CM

## 2021-02-16 DIAGNOSIS — R059 Cough, unspecified: Secondary | ICD-10-CM

## 2021-02-16 DIAGNOSIS — F17200 Nicotine dependence, unspecified, uncomplicated: Secondary | ICD-10-CM

## 2021-02-16 DIAGNOSIS — U071 COVID-19: Secondary | ICD-10-CM

## 2021-02-16 DIAGNOSIS — R079 Chest pain, unspecified: Secondary | ICD-10-CM

## 2021-02-16 MED ORDER — PROMETHAZINE-DM 6.25-15 MG/5ML PO SYRP: 5.0000 mL | ORAL_SOLUTION | Freq: Every evening | ORAL | 0 refills | Status: DC | PRN

## 2021-02-16 MED ORDER — BENZONATATE 100 MG PO CAPS: 100.0000 mg | ORAL_CAPSULE | Freq: Three times a day (TID) | ORAL | 0 refills | Status: DC | PRN

## 2021-02-16 MED ORDER — PREDNISONE 20 MG PO TABS: ORAL_TABLET | ORAL | 0 refills | Status: DC

## 2021-02-16 NOTE — ED Provider Notes (Signed)
[facility]   MRN: [id number] DOB: 11-11-1990  Subjective:   Ana Watson is a 30 y.o. female presenting for 2-week history of persistent, wheezing and chest congestion.  Patient tested positive for COVID-19 2 weeks ago.  She is not quit smoking, has been using Mucinex with minimal relief.  Denies active chest pain, difficulty breathing.  Denies history of COPD, asthma.  No current facility-administered medications for this encounter. No current outpatient medications on file.   Allergies  Allergen Reactions   Percocet [Oxycodone-Acetaminophen] Nausea And Vomiting    No past medical history on file.   History reviewed. No pertinent surgical history.  No family history on file.  Social History   Tobacco Use   Smoking status: Every Day    Packs/day: 0.50    Years: 6.00    Pack years: 3.00    Types: Cigarettes   Smokeless tobacco: Never  Vaping Use   Vaping Use: Never used  Substance Use Topics   Alcohol use: Yes    Comment: occasional   Drug use: Yes    Types: Marijuana    ROS   Objective:   Vitals: BP 99/72 (BP Location: Right Arm) Comment (BP Location): large cuff  Pulse 72   Temp 99.6 F (37.6 C) (Oral)   Resp (!) 22   LMP 02/16/2021   SpO2 99%   Physical Exam Constitutional:      General: She is not in acute distress.    Appearance: Normal appearance. She is well-developed. She is obese. She is not ill-appearing, toxic-appearing or diaphoretic.  HENT:     Head: Normocephalic and atraumatic.     Nose: Nose normal.     Mouth/Throat:     Mouth: Mucous membranes are moist.  Eyes:     Extraocular Movements: Extraocular movements intact.     Pupils: Pupils are equal, round, and reactive to light.  Cardiovascular:     Rate and Rhythm: Normal rate and regular rhythm.     Pulses: Normal pulses.     Heart sounds: Normal heart sounds. No murmur heard.   No friction rub. No gallop.  Pulmonary:     Effort: Pulmonary effort is  normal. No respiratory distress.     Breath sounds: Normal breath sounds. No stridor. No wheezing, rhonchi or rales.  Skin:    General: Skin is warm and dry.     Findings: No rash.  Neurological:     Mental Status: She is alert and oriented to person, place, and time.  Psychiatric:        Mood and Affect: Mood normal.        Behavior: Behavior normal.        Thought Content: Thought content normal.    DG Chest 2 View  Result Date: 02/16/2021 CLINICAL DATA:  Cough and chest pain, COVID-19 positivity 2 weeks ago EXAM: CHEST - 2 VIEW COMPARISON:  06/09/2018 FINDINGS: Cardiac shadow is within normal limits. The lungs are well aerated bilaterally. No focal infiltrate or sizable effusion is seen. No bony abnormality is noted. IMPRESSION: No active cardiopulmonary disease. Electronically Signed   By: Inez Catalina M.D.   On: 02/16/2021 18:30     Assessment and Plan :   PDMP not reviewed this encounter.  1. Atypical chest pain   2. COVID-19   3. Cough   4. Smoker     In light of her ongoing symptoms, history of smoking and negative chest x-ray offered patient a prednisone course.  Emphasized need to quit smoking at least until she is better.  Use supportive care otherwise. Counseled patient on potential for adverse effects with medications prescribed/recommended today, ER and return-to-clinic precautions discussed, patient verbalized understanding.    Jaynee Eagles, [place] 02/16/21 0355

## 2021-02-16 NOTE — ED Triage Notes (Addendum)
Patient reports testing positive for covid 2 weeks ago.  Patient reports she has had difficulty fighting cough and breathing has worsened over the last 4 days.  Chest is tight.  Patient is coughing up yellow phlegm.  Patient is finding it hard to cough up phlegm.  Has been taking mucinex with no relief,ears are stuffy and have the "broken speaker sound"

## 2021-03-19 DIAGNOSIS — H900 Conductive hearing loss, bilateral: Secondary | ICD-10-CM | POA: Insufficient documentation

## 2021-03-19 DIAGNOSIS — H6983 Other specified disorders of Eustachian tube, bilateral: Secondary | ICD-10-CM | POA: Insufficient documentation

## 2021-08-05 ENCOUNTER — Emergency Department (HOSPITAL_COMMUNITY)
Admission: EM | Admit: 2021-08-05 | Discharge: 2021-08-05 | Disposition: A | Discharge status: Home / Self Care | Payer: Self-pay | Attending: Emergency Medicine | Admitting: Emergency Medicine

## 2021-08-05 ENCOUNTER — Other Ambulatory Visit: Payer: Self-pay

## 2021-08-05 ENCOUNTER — Encounter (HOSPITAL_COMMUNITY): Payer: Self-pay

## 2021-08-05 DIAGNOSIS — F17210 Nicotine dependence, cigarettes, uncomplicated: Secondary | ICD-10-CM | POA: Insufficient documentation

## 2021-08-05 DIAGNOSIS — N9489 Other specified conditions associated with female genital organs and menstrual cycle: Secondary | ICD-10-CM | POA: Insufficient documentation

## 2021-08-05 DIAGNOSIS — N939 Abnormal uterine and vaginal bleeding, unspecified: Secondary | ICD-10-CM | POA: Insufficient documentation

## 2021-08-05 DIAGNOSIS — R5383 Other fatigue: Secondary | ICD-10-CM | POA: Insufficient documentation

## 2021-08-05 LAB — CBC WITH DIFFERENTIAL/PLATELET
Abs Immature Granulocytes: 0.02 10*3/uL (ref 0.00–0.07)
Basophils Absolute: 0.1 10*3/uL (ref 0.0–0.1)
Basophils Relative: 1 %
Eosinophils Absolute: 0.3 10*3/uL (ref 0.0–0.5)
Eosinophils Relative: 4 %
HCT: 37 % (ref 36.0–46.0)
Hemoglobin: 11.6 g/dL — ABNORMAL LOW (ref 12.0–15.0)
Immature Granulocytes: 0 %
Lymphocytes Relative: 42 %
Lymphs Abs: 2.9 10*3/uL (ref 0.7–4.0)
MCH: 28.2 pg (ref 26.0–34.0)
MCHC: 31.4 g/dL (ref 30.0–36.0)
MCV: 89.8 fL (ref 80.0–100.0)
Monocytes Absolute: 0.6 10*3/uL (ref 0.1–1.0)
Monocytes Relative: 9 %
Neutro Abs: 3.2 10*3/uL (ref 1.7–7.7)
Neutrophils Relative %: 44 %
Platelets: 271 10*3/uL (ref 150–400)
RBC: 4.12 MIL/uL (ref 3.87–5.11)
RDW: 12.5 % (ref 11.5–15.5)
WBC: 7 10*3/uL (ref 4.0–10.5)
nRBC: 0 % (ref 0.0–0.2)

## 2021-08-05 LAB — WET PREP, GENITAL
Sperm: NONE SEEN
Trich, Wet Prep: NONE SEEN
WBC, Wet Prep HPF POC: <10 (ref <10)
Yeast Wet Prep HPF POC: NONE SEEN

## 2021-08-05 LAB — COMPREHENSIVE METABOLIC PANEL
ALT: 22 U/L (ref 0–44)
AST: 21 U/L (ref 15–41)
Albumin: 4 g/dL (ref 3.5–5.0)
Alkaline Phosphatase: 66 U/L (ref 38–126)
Anion gap: 6 (ref 5–15)
BUN: 14 mg/dL (ref 6–20)
CO2: 27 mmol/L (ref 22–32)
Calcium: 9 mg/dL (ref 8.9–10.3)
Chloride: 103 mmol/L (ref 98–111)
Creatinine, Ser: 0.78 mg/dL (ref 0.44–1.00)
GFR, Estimated: >60 mL/min (ref >60)
Glucose, Bld: 148 mg/dL — ABNORMAL HIGH (ref 70–99)
Potassium: 3.7 mmol/L (ref 3.5–5.1)
Sodium: 136 mmol/L (ref 135–145)
Total Bilirubin: 0.6 mg/dL (ref 0.3–1.2)
Total Protein: 7.6 g/dL (ref 6.5–8.1)

## 2021-08-05 LAB — I-STAT BETA HCG BLOOD, ED (MC, WL, AP ONLY): I-stat hCG, quantitative: <5 m[IU]/mL (ref <5)

## 2021-08-05 LAB — TSH: TSH: 2.581 u[IU]/mL (ref 0.350–4.500)

## 2021-08-05 MED ORDER — NAPROXEN 500 MG PO TABS
500.0000 mg | ORAL_TABLET | Freq: Once | ORAL | Status: AC
  Administered 2021-08-05: 500 mg via ORAL
  Filled 2021-08-05: qty 1

## 2021-08-05 MED ORDER — ONDANSETRON 4 MG PO TBDP
4.0000 mg | ORAL_TABLET | Freq: Once | ORAL | Status: AC
  Administered 2021-08-05: 4 mg via ORAL
  Filled 2021-08-05: qty 1

## 2021-08-05 MED ORDER — FERROUS SULFATE 325 (65 FE) MG PO TABS: 325.0000 mg | ORAL_TABLET | Freq: Every day | ORAL | 0 refills | Status: DC

## 2021-08-05 MED ORDER — NAPROXEN 500 MG PO TABS: 500.0000 mg | ORAL_TABLET | Freq: Two times a day (BID) | ORAL | 0 refills | Status: DC | PRN

## 2021-08-05 MED ORDER — ONDANSETRON 4 MG PO TBDP: 4.0000 mg | ORAL_TABLET | Freq: Three times a day (TID) | ORAL | 0 refills | Status: DC | PRN

## 2021-08-05 NOTE — ED Notes (Signed)
Save gold and pink tube in main lab

## 2021-08-05 NOTE — Discharge Instructions (Addendum)
You were seen in the emergency department today for vaginal bleeding and fatigue.  Your blood work was overall reassuring.  You do have some mild anemia with a hemoglobin of 11.6.  We are sending you home with an iron supplement to take, sometimes this can upset stomach and lead to constipation, please take this as tolerated.  We are also sending you home with Zofran to take every 8 hours as needed for nausea and vomiting and naproxen take every 12 hours as needed for pain. Naproxen is a nonsteroidal anti-inflammatory medication to help with pain. Be sure to take this medication as prescribed with food, 1 pill every 12 hours,  It should be taken with food, as it can cause stomach upset, and more seriously, stomach bleeding. Do not take other nonsteroidal anti-inflammatory medications with this such as Advil, Motrin, Aleve, Mobic, Goodie Powder, or Motrin etc..    You make take Tylenol per over the counter dosing with these medications.   We have prescribed you new medication(s) today. Discuss the medications prescribed today with your pharmacist as they can have adverse effects and interactions with your other medicines including over the counter and prescribed medications. Seek medical evaluation if you start to experience new or abnormal symptoms after taking one of these medicines, seek care immediately if you start to experience difficulty breathing, feeling of your throat closing, facial swelling, or rash as these could be indications of a more serious allergic reaction  Would like you to follow-up with your primary care provider and/or OB/GYN as soon as possible.  We have given you information for [facility] clinic as well as our women Center.  Please call to schedule an appointment.  Return to the emergency department for new or worsening symptoms including but not limited to new or worsening pain, fever, inability to keep fluids down, dizziness, lightheadedness, passing out, increased bleeding, or  any other concerns.

## 2021-08-05 NOTE — ED Triage Notes (Signed)
Pt states that she has been having blood clots with her recent period that started on the 2nd. Pt states that this is new for her and she has been feeling more tired, she brought a sample of a clot.

## 2021-08-05 NOTE — ED Provider Notes (Signed)
[facility] DEPT Provider Note   CSN: [id number] Arrival date & time: 08/05/21  7829     History Chief Complaint  Patient presents with   heavy period with clots    Ana Watson is a 29 y.o. female with a hx of tobacco use who presents to the ED with complaints of vaginal bleeding x 4 days and fatigue for past few weeks.  Patient reports she started her period about 4 days ago, she has been having heavier than normal vaginal bleeding including clot passage and having to change her tampon every 2 hours.  She is having associated intermittent cramps, fairly similar to prior, and has also had some nausea without vomiting.  She notes that she has been very fatigued/tired over the past few weeks, she has gained some weight, on the lifestyle changes that she recently moved.  She denies fever, vomiting, diarrhea, dysuria, vaginal discharge, chest pain, or shortness of breath.  States recently checked for STDs and this was negative, no concern for STD specifically per patient. She does not take hormone supplements of any kind including OCP.   HPI     History reviewed. No pertinent past medical history.  There are no problems to display for this patient.   History reviewed. No pertinent surgical history.   OB History   No obstetric history on file.     History reviewed. No pertinent family history.  Social History   Tobacco Use   Smoking status: Every Day    Packs/day: 0.50    Years: 6.00    Pack years: 3.00    Types: Cigarettes   Smokeless tobacco: Never  Vaping Use   Vaping Use: Never used  Substance Use Topics   Alcohol use: Yes    Comment: occasional   Drug use: Yes    Types: Marijuana    Home Medications Prior to Admission medications   Medication Sig Start Date End Date Taking? Authorizing Provider  benzonatate (TESSALON) 100 MG capsule Take 1-2 capsules (100-200 mg total) by mouth 3 (three) times daily as needed. 02/16/21   Jaynee Eagles, PA-C  predniSONE (DELTASONE) 20 MG tablet Take 2 tablets daily with breakfast. 02/16/21   Jaynee Eagles, PA-C  promethazine-dextromethorphan (PROMETHAZINE-DM) 6.25-15 MG/5ML syrup Take 5 mLs by mouth at bedtime as needed for cough. 02/16/21   Jaynee Eagles, PA-C    Allergies    Percocet [oxycodone-acetaminophen]  Review of Systems   Review of Systems  Constitutional:  Positive for fatigue and unexpected weight change. Negative for chills and fever.  Respiratory:  Negative for shortness of breath.   Cardiovascular:  Negative for chest pain.  Gastrointestinal:  Positive for nausea. Negative for constipation, diarrhea and vomiting.  Genitourinary:  Positive for vaginal bleeding. Negative for dysuria and vaginal discharge.  Neurological:  Positive for weakness (generalized). Negative for syncope.  All other systems reviewed and are negative.  Physical Exam Updated Vital Signs BP 125/81   Pulse 76   Temp 98.2 F (36.8 C) (Oral)   Resp 16   SpO2 98%   Physical Exam Vitals and nursing note reviewed.  Constitutional:      General: She is not in acute distress.    Appearance: She is well-developed. She is not toxic-appearing.  HENT:     Head: Normocephalic and atraumatic.  Eyes:     General:        Right eye: No discharge.        Left eye: No discharge.  Conjunctiva/sclera: Conjunctivae normal.  Cardiovascular:     Rate and Rhythm: Normal rate and regular rhythm.  Pulmonary:     Effort: Pulmonary effort is normal. No respiratory distress.     Breath sounds: Normal breath sounds. No wheezing, rhonchi or rales.  Abdominal:     General: There is no distension.     Palpations: Abdomen is soft.     Tenderness: There is no abdominal tenderness. There is no guarding or rebound.  Genitourinary:    Comments: NT present as chaperone.  No external lesions.  Small amount of blood present in the vaginal canal.  No significant hemorrhaging.  No clots present.  No significant cervical  motion tenderness or adnexal tenderness. Musculoskeletal:     Cervical back: Neck supple.  Skin:    General: Skin is warm and dry.     Findings: No rash.  Neurological:     Mental Status: She is alert.     Comments: Clear speech.   Psychiatric:        Behavior: Behavior normal.    ED Results / Procedures / Treatments   Labs (all labs ordered are listed, but only abnormal results are displayed) Labs Reviewed  COMPREHENSIVE METABOLIC PANEL - Abnormal; Notable for the following components:      Result Value   Glucose, Bld 148 (*)    All other components within normal limits  CBC WITH DIFFERENTIAL/PLATELET - Abnormal; Notable for the following components:   Hemoglobin 11.6 (*)    All other components within normal limits  I-STAT BETA HCG BLOOD, ED ([facility], [facility], [facility] ONLY)    EKG None  Radiology No results found.  Procedures Procedures   Medications Ordered in ED Medications - No data to display  ED Course  I have reviewed the triage vital signs and the nursing notes.  Pertinent labs & imaging results that were available during my care of the patient were reviewed by me and considered in my medical decision making (see chart for details).    MDM Rules/Calculators/A&P                           Patient presents to the ED with complaints of fatigue over the past few weeks and heavier than normal vaginal bleeding with her menstrual cycle over the past 4 days.  She is nontoxic, resting comfortably, vitals are within normal limits.  Abdominal exam is benign.  Pelvic exam with some mild amount of blood present, however no significant hemorrhaging or clots at this time.  Exam is not consistent with PID.  Additional history obtained:  Additional history obtained from chart review & nursing note review.   Lab Tests:  I Ordered, reviewed, and interpreted labs, which included:  Pregnancy test: Negative CBC: Mild anemia no prior record for comparison CMP: Mild hyperglycemia Wet  prep: clue cells present GC/chlamydia: Pending TSH: Within normal limits.  ED Course:  In terms of patient's fatigue: No significant electrolyte, liver, or renal dysfunction. TSH WNL. No critical anemia, mild with hgb of 11.6- will start iron supplement. PCP follow up.  In terms of vaginal bleeding: Likely heavier than normal menstrual cycle.  No significant hemorrhaging on exam.  Hemoglobin and hematocrit are not critically low.  No significant abdominal tenderness to palpation or tenderness to bimanual exam.  No concern for STI, recent negative STD testing, do not suspect PID.  Pregnancy test is negative.  Clue cells present - patient relays she was recently treated  for BV, she has no discharge, pruritus, or odor, her cramps are typical of her menstrual cycle- do not feel this needs tx at this time which she is in agreement with.  Given Zofran and naproxen for her symptoms with improvement, tolerating p.o., she overall appears reasonable for discharge home with primary care/OB/GYN follow-up.  I discussed results, treatment plan, need for follow-up, and return precautions with the patient. Provided opportunity for questions, patient confirmed understanding and is in agreement with plan.    Portions of this note were generated with L[employer]. Dictation errors may occur despite best attempts at proofreading.  Final Clinical Impression(s) / ED Diagnoses Final diagnoses:  Vaginal bleeding  Fatigue, unspecified type    Rx / DC Orders ED Discharge Orders          Ordered    naproxen (NAPROSYN) 500 MG tablet  2 times daily PRN        08/05/21 0615    ondansetron (ZOFRAN-ODT) 4 MG disintegrating tablet  Every 8 hours PRN        08/05/21 0615    ferrous sulfate 325 (65 FE) MG tablet  Daily        08/05/21 0615             Amaryllis Dyke, PA-C 08/05/21 5400    Quintella Reichert, MD 08/05/21 [phone]

## 2021-08-06 LAB — GC/CHLAMYDIA PROBE AMP (~~LOC~~) NOT AT ARMC
Chlamydia: NEGATIVE
Comment: NEGATIVE
Comment: NORMAL
Neisseria Gonorrhea: NEGATIVE

## 2021-09-03 ENCOUNTER — Encounter (HOSPITAL_COMMUNITY): Payer: Self-pay

## 2021-09-03 ENCOUNTER — Emergency Department (HOSPITAL_COMMUNITY)
Admission: EM | Admit: 2021-09-03 | Discharge: 2021-09-03 | Disposition: A | Discharge status: Home / Self Care | Payer: Medicaid Other | Attending: Emergency Medicine | Admitting: Emergency Medicine

## 2021-09-03 DIAGNOSIS — R109 Unspecified abdominal pain: Secondary | ICD-10-CM | POA: Insufficient documentation

## 2021-09-03 DIAGNOSIS — Z5321 Procedure and treatment not carried out due to patient leaving prior to being seen by health care provider: Secondary | ICD-10-CM | POA: Diagnosis not present

## 2021-09-03 LAB — CBC
HCT: 38.7 % (ref 36.0–46.0)
Hemoglobin: 12.3 g/dL (ref 12.0–15.0)
MCH: 28.3 pg (ref 26.0–34.0)
MCHC: 31.8 g/dL (ref 30.0–36.0)
MCV: 89 fL (ref 80.0–100.0)
Platelets: 286 10*3/uL (ref 150–400)
RBC: 4.35 MIL/uL (ref 3.87–5.11)
RDW: 12.5 % (ref 11.5–15.5)
WBC: 6.7 10*3/uL (ref 4.0–10.5)
nRBC: 0 % (ref 0.0–0.2)

## 2021-09-03 LAB — COMPREHENSIVE METABOLIC PANEL
ALT: 38 U/L (ref 0–44)
AST: 27 U/L (ref 15–41)
Albumin: 3.8 g/dL (ref 3.5–5.0)
Alkaline Phosphatase: 64 U/L (ref 38–126)
Anion gap: 4 — ABNORMAL LOW (ref 5–15)
BUN: 10 mg/dL (ref 6–20)
CO2: 24 mmol/L (ref 22–32)
Calcium: 9 mg/dL (ref 8.9–10.3)
Chloride: 108 mmol/L (ref 98–111)
Creatinine, Ser: 0.64 mg/dL (ref 0.44–1.00)
GFR, Estimated: >60 mL/min (ref >60)
Glucose, Bld: 145 mg/dL — ABNORMAL HIGH (ref 70–99)
Potassium: 3.7 mmol/L (ref 3.5–5.1)
Sodium: 136 mmol/L (ref 135–145)
Total Bilirubin: 0.6 mg/dL (ref 0.3–1.2)
Total Protein: 7.4 g/dL (ref 6.5–8.1)

## 2021-09-03 LAB — LIPASE, BLOOD: Lipase: 30 U/L (ref 11–51)

## 2021-09-03 LAB — I-STAT BETA HCG BLOOD, ED (MC, WL, AP ONLY): I-stat hCG, quantitative: 70.6 m[IU]/mL — ABNORMAL HIGH (ref <5)

## 2021-09-03 NOTE — ED Provider Triage Note (Signed)
Emergency Medicine Provider Triage Evaluation Note  Ana Watson , a 31 y.o. female  was evaluated in triage.  Pt complains of abdominal pain.  She states that same has been ongoing for the last 2 days.  Of note, she states that she is 4 days late on her menstrual cycle and that there is a chance she could be pregnant.  She states that she is never late with her menstrual cycle.  She states that her pain is cramping and in nature and feels like menstrual pain.  She denies dysuria, vaginal discharge, or vaginal pain.  No fevers, chills, nausea vomiting or diarrhea.  Review of Systems  Positive:  Negative: See above   Physical Exam  BP (!) 110/94    Pulse 82    Temp 99 F (37.2 C) (Oral)    Resp 16    LMP 07/30/2021 (Approximate)    SpO2 100%  Gen:   Awake, no distress   Resp:  Normal effort  MSK:   Moves extremities without difficulty  Other:  No abdominal tenderness to palpation  Medical Decision Making  Medically screening exam initiated at 11:37 AM.  Appropriate orders placed.  Ana Watson was informed that the remainder of the evaluation will be completed by another provider, this initial triage assessment does not replace that evaluation, and the importance of remaining in the ED until their evaluation is complete.     Bud Face, PA-C 09/03/21 1143

## 2021-09-03 NOTE — ED Triage Notes (Signed)
Pt arrived via POV c/o for lower abdominal pain (cramping) and fatigue X2.   Also c/o left ear pain X1 day.   Pt here in last month and was prescribed pills for anemia per patient. Pt stopped taking iron pills due to hurting her stomach.    A/Ox4 Ambulatory  LMP- Dec 1st 2022

## 2021-09-03 NOTE — ED Triage Notes (Signed)
Pt told Horris Latino, EMT she was leaving. Pt eloped. Pt did not wait to speak to RN or provider.

## 2021-09-04 ENCOUNTER — Inpatient Hospital Stay (HOSPITAL_COMMUNITY): Payer: Medicaid Other

## 2021-09-04 ENCOUNTER — Inpatient Hospital Stay (HOSPITAL_COMMUNITY)
Admission: AD | Admit: 2021-09-04 | Discharge: 2021-09-04 | Disposition: A | Discharge status: Home / Self Care | Payer: Medicaid Other | Attending: Obstetrics and Gynecology | Admitting: Obstetrics and Gynecology

## 2021-09-04 ENCOUNTER — Encounter (HOSPITAL_COMMUNITY): Payer: Self-pay

## 2021-09-04 DIAGNOSIS — Z3A01 Less than 8 weeks gestation of pregnancy: Secondary | ICD-10-CM | POA: Diagnosis not present

## 2021-09-04 DIAGNOSIS — R109 Unspecified abdominal pain: Secondary | ICD-10-CM | POA: Diagnosis not present

## 2021-09-04 DIAGNOSIS — R110 Nausea: Secondary | ICD-10-CM | POA: Diagnosis not present

## 2021-09-04 DIAGNOSIS — O3680X0 Pregnancy with inconclusive fetal viability, not applicable or unspecified: Secondary | ICD-10-CM

## 2021-09-04 DIAGNOSIS — O26891 Other specified pregnancy related conditions, first trimester: Secondary | ICD-10-CM | POA: Diagnosis present

## 2021-09-04 DIAGNOSIS — O26899 Other specified pregnancy related conditions, unspecified trimester: Secondary | ICD-10-CM

## 2021-09-04 LAB — WET PREP, GENITAL
Sperm: NONE SEEN
Trich, Wet Prep: NONE SEEN
WBC, Wet Prep HPF POC: >=10 — AB (ref <10)
Yeast Wet Prep HPF POC: NONE SEEN

## 2021-09-04 LAB — URINALYSIS, ROUTINE W REFLEX MICROSCOPIC
Bilirubin Urine: NEGATIVE
Glucose, UA: NEGATIVE mg/dL
Hgb urine dipstick: NEGATIVE
Ketones, ur: NEGATIVE mg/dL
Leukocytes,Ua: NEGATIVE
Nitrite: NEGATIVE
Protein, ur: NEGATIVE mg/dL
Specific Gravity, Urine: >1.03 — ABNORMAL HIGH (ref 1.005–1.030)
pH: 6 (ref 5.0–8.0)

## 2021-09-04 LAB — ABO/RH: ABO/RH(D): O POS

## 2021-09-04 LAB — HCG, QUANTITATIVE, PREGNANCY: hCG, Beta Chain, Quant, S: 138 m[IU]/mL — ABNORMAL HIGH (ref <5)

## 2021-09-04 MED ORDER — ACETAMINOPHEN 500 MG PO TABS: 1000.0000 mg | ORAL_TABLET | Freq: Once | ORAL | Status: DC

## 2021-09-04 MED ORDER — PREPLUS 27-1 MG PO TABS: 1.0000 | ORAL_TABLET | Freq: Every day | ORAL | 6 refills | Status: DC

## 2021-09-04 NOTE — MAU Provider Note (Signed)
History     CSN: [id number]  Arrival date and time: 09/04/21 9449   Event Date/Time   First Provider Initiated Contact with Patient 09/04/21 2155      Chief Complaint  Patient presents with   Abdominal Pain   Ana Watson is a 31 y.o. G1P0 at 107w1d by Definite LMP of 07/30/2021.  She presents today for Abdominal Pain.  She states she has been experiencing intermittent, bilateral cramping since prior to "when my period was suppose to come."  She states it "is not really bad cramps, I am just concerned."  She denies vaginal bleeding or discharge.       OB History     Gravida  1   Para      Term      Preterm      AB      Living         SAB      IAB      Ectopic      Multiple      Live Births              No past medical history on file.  No past surgical history on file.  No family history on file.  Social History   Tobacco Use   Smoking status: Every Day    Packs/day: 0.50    Years: 6.00    Pack years: 3.00    Types: Cigarettes   Smokeless tobacco: Never  Vaping Use   Vaping Use: Never used  Substance Use Topics   Alcohol use: Yes    Comment: occasional   Drug use: Yes    Types: Marijuana    Allergies:  Allergies  Allergen Reactions   Percocet [Oxycodone-Acetaminophen] Nausea And Vomiting    Medications Prior to Admission  Medication Sig Dispense Refill Last Dose   benzonatate (TESSALON) 100 MG capsule Take 1-2 capsules (100-200 mg total) by mouth 3 (three) times daily as needed. 60 capsule 0    ferrous sulfate 325 (65 FE) MG tablet Take 1 tablet (325 mg total) by mouth daily. 15 tablet 0    naproxen (NAPROSYN) 500 MG tablet Take 1 tablet (500 mg total) by mouth 2 (two) times daily as needed for moderate pain. 15 tablet 0    ondansetron (ZOFRAN-ODT) 4 MG disintegrating tablet Take 1 tablet (4 mg total) by mouth every 8 (eight) hours as needed for nausea or vomiting. 5 tablet 0    predniSONE (DELTASONE) 20 MG tablet Take 2  tablets daily with breakfast. 10 tablet 0    promethazine-dextromethorphan (PROMETHAZINE-DM) 6.25-15 MG/5ML syrup Take 5 mLs by mouth at bedtime as needed for cough. 100 mL 0     Review of Systems  Gastrointestinal:  Positive for abdominal pain (Cramping), constipation, diarrhea and nausea. Negative for vomiting.  Genitourinary:  Negative for difficulty urinating, dysuria, pelvic pain, vaginal bleeding and vaginal discharge.  Physical Exam   Blood pressure 118/79, pulse 71, temperature 98.3 F (36.8 C), temperature source Oral, resp. rate 18, height 5\' 4"  (1.626 m), weight 111.2 kg, last menstrual period 07/30/2021, SpO2 100 %.  Physical Exam Constitutional:      Appearance: Normal appearance. She is well-developed. She is obese.  HENT:     Head: Normocephalic and atraumatic.  Eyes:     Conjunctiva/sclera: Conjunctivae normal.  Cardiovascular:     Rate and Rhythm: Normal rate and regular rhythm.  Pulmonary:     Effort: Pulmonary effort is normal. No respiratory distress.  Breath sounds: Normal breath sounds.  Abdominal:     General: Bowel sounds are normal.     Tenderness: There is no abdominal tenderness.  Musculoskeletal:        General: Normal range of motion.  Skin:    General: Skin is warm and dry.  Neurological:     Mental Status: She is alert and oriented to person, place, and time.  Psychiatric:        Mood and Affect: Mood normal.        Behavior: Behavior normal.        Thought Content: Thought content normal.    MAU Course  Procedures Results for orders placed or performed during the hospital encounter of 09/04/21 (from the past 24 hour(s))  Urinalysis, Routine w reflex microscopic     Status: Abnormal   Collection Time: 09/04/21  8:34 PM  Result Value Ref Range   Color, Urine YELLOW YELLOW   APPearance CLOUDY (A) CLEAR   Specific Gravity, Urine >1.030 (H) 1.005 - 1.030   pH 6.0 5.0 - 8.0   Glucose, UA NEGATIVE NEGATIVE mg/dL   Hgb urine dipstick  NEGATIVE NEGATIVE   Bilirubin Urine NEGATIVE NEGATIVE   Ketones, ur NEGATIVE NEGATIVE mg/dL   Protein, ur NEGATIVE NEGATIVE mg/dL   Nitrite NEGATIVE NEGATIVE   Leukocytes,Ua NEGATIVE NEGATIVE  Wet prep, genital     Status: Abnormal   Collection Time: 09/04/21  8:50 PM  Result Value Ref Range   Yeast Wet Prep HPF POC NONE SEEN NONE SEEN   Trich, Wet Prep NONE SEEN NONE SEEN   Clue Cells Wet Prep HPF POC PRESENT (A) NONE SEEN   WBC, Wet Prep HPF POC >=10 (A) <10   Sperm NONE SEEN   ABO/Rh     Status: None   Collection Time: 09/04/21  9:09 PM  Result Value Ref Range   ABO/RH(D)      O POS Performed at [facility], [address], [place], [place] [address]   hCG, quantitative, pregnancy     Status: Abnormal   Collection Time: 09/04/21  9:09 PM  Result Value Ref Range   hCG, Beta Chain, Quant, S 138 (H) <5 mIU/mL   US OB LESS THAN 14 WEEKS WITH OB TRANSVAGINAL  Result Date: 09/04/2021 CLINICAL DATA:  Cramping since 09/01/2021. Last menstrual period 07/30/2021. Gestational age by last menstrual in 5 weeks and 1 day. Estimated due date by last menstrual period 04/05/2022. EXAM: OBSTETRIC <14 WK [place] AND TRANSVAGINAL OB US TECHNIQUE: Both transabdominal and transvaginal ultrasound examinations were performed for complete evaluation of the gestation as well as the maternal uterus, adnexal regions, and pelvic cul-de-sac. Transvaginal technique was performed to assess early pregnancy. COMPARISON:  None. FINDINGS: Intrauterine gestational sac: None Yolk sac:  Not Visualized. Embryo:  Not Visualized. Cardiac Activity: Not Visualized. Subchorionic hemorrhage:  None visualized. Maternal uterus/adnexae: Bilateral ovaries unremarkable. Fundal fibroid is noted measuring 2.9 x 2.4 x 2.5 cm. The endometrium measures up to 22 mm. Other: No free fluid within the pelvis. IMPRESSION: 1. Non-localization of the pregnancy on this scan. No intrauterine gestational sac. No abnormal ovarian or adnexal masses.  Differential diagnosis includes intrauterine gestation too early to visualize, spontaneous abortion, or occult ectopic gestation. Recommend close clinical follow-up and serial serum beta-HCG monitoring, with repeat obstetric scan as warranted by beta-HCG levels and clinical assessment. 2. Fundal uterine fibroid. Electronically Signed   By: Iven Finn M.D.   On: 09/04/2021 23:18     MDM  Physical Exam Cultures: Wet Prep and GC/CT Labs: UA, hCG, ABO Ultrasound Pain Medication Assessment and Plan  31 year old G1P0 at 5.1 weeks Abdominal Cramping Nausea  -Reviewed POC with patient. -Exam performed.  -Patient self swab and wet prep returns significant for clue cells. -Discussed how BV can contribute to feelings of cramping in pregnancy. -Patient endorses having in past.  Will send script for metrogel to pharmacy on file. -Previous hCG level 70.8.  Will send for [place] to r/o ectopic. -Cautioned that no embryo will likely be seen at this early GA.  -Patient verbalizes understanding. -Patient offered and accepts pain medication, but declines antiemetic.  Will give tylenol.   Maryann Conners 09/04/2021, 9:55 PM   Reassessment (11:34 PM) Uterine Fibroid Pregnancy of Unknown Anatomical Location  -[place] returns as above. -Provider to bedside to discuss. -Informed and educated on uterine fibroid identified.  Reassured that fibroid of this size usually does not cause issues during pregnancy.  Information provided in AVS.  -Discussed lack of embryo visualization and reassured an anticipated finding. -Further informed that hCG has risen significantly in past 36 hours. -However, will follow up with repeat hCG in 48 hours to ensure continued and appropriate rise.  -Appt made for Stat hCG on Monday Jan 9th at [facility]. Informed that if appropriate rise noted will schedule for [place] in 2-4 weeks.  -Will send script for PNV and patient encouraged to start.  -No other questions or concerns. -Patient reports  she did not receive pain medication and no longer desires. -Bleeding Precautions Given -Encouraged to return to MAU if symptoms worsen or with the onset of new symptoms. -Discharged to home in stable condition.  Maryann Conners MSN, CNM Advanced Practice Provider, [facility]

## 2021-09-04 NOTE — MAU Note (Signed)
..  Ana Watson is a 31 y.o. at Unknown here in MAU reporting: cramping since before her period was supposed to occur, and continued since finding out she was pregnant 09/01/2021. Pt reports feeling mid period cramps. Pt denies VB and LOF. Last intercourse was 08/30/2021.  LMP: 07/30/2021  Pain score: 5/10 Vitals:   09/04/21 2027  BP: 118/79  Pulse: 71  Resp: 18  Temp: 98.3 F (36.8 C)  SpO2: 100%     Lab orders placed from triage:  wet prep, UA

## 2021-09-05 MED ORDER — METRONIDAZOLE 0.75 % VA GEL: 1.0000 | Freq: Every day | VAGINAL | 0 refills | Status: DC

## 2021-09-06 ENCOUNTER — Other Ambulatory Visit (HOSPITAL_COMMUNITY): Payer: Medicaid Other

## 2021-09-07 ENCOUNTER — Other Ambulatory Visit: Payer: Self-pay

## 2021-09-07 ENCOUNTER — Ambulatory Visit (INDEPENDENT_AMBULATORY_CARE_PROVIDER_SITE_OTHER): Payer: Medicaid Other | Admitting: *Deleted

## 2021-09-07 VITALS — BP 116/79 | HR 85 | Ht 64.0 in | Wt 245.5 lb

## 2021-09-07 DIAGNOSIS — O3680X0 Pregnancy with inconclusive fetal viability, not applicable or unspecified: Secondary | ICD-10-CM

## 2021-09-07 LAB — GC/CHLAMYDIA PROBE AMP (~~LOC~~) NOT AT ARMC
Chlamydia: NEGATIVE
Comment: NEGATIVE
Comment: NORMAL
Neisseria Gonorrhea: NEGATIVE

## 2021-09-07 LAB — BETA HCG QUANT (REF LAB): hCG Quant: 346 m[IU]/mL

## 2021-09-07 NOTE — Progress Notes (Signed)
Stat BHCG of 346.2 received. Reviewed with Dr. Ilda Basset and orders to repeat US in 2- 2.5 weeks and to tell patient appropriate rise in BHCG . Also to start prenatal care.  I called Ana Watson and gave her the results and recommendations and [place] appointment. She voices understanding. Wilburn Keir,RN

## 2021-09-07 NOTE — Patient Instructions (Signed)
Prenatal Care Providers           [facility]  [address] [phone]  [facility]   [address]  [phone]  [facility] @ [facility]       [address] [phone]            [facility] @      [address] [phone]          [facility] @ [place]   [address] [phone]  [facility] @ [facility]  [address] [phone]     [facility] @ [facility])  [address]   [phone]     [facility]  Phone: [phone]  [facility]  Phone: [phone]  [facility] Phone: [phone]  [facility] Phone: [phone]  [facility] Phone: [phone]  [facility] Phone: [phone]  [facility]  Phone: [phone]  

## 2021-09-07 NOTE — Progress Notes (Signed)
Here for stat bhcg. Denies pain. Denies bleeding. Explained we will draw stat bhcg and have her leave office. She will be called with results in about 2 hours after results received and reviewed by provider. She voices understanding.   he did have food insecurity marked as issue in last year, but states is fine now Loana Salvaggio,RN

## 2021-09-08 NOTE — Progress Notes (Signed)
Patient was assessed and managed by nursing staff during this encounter. I have reviewed the chart and agree with the documentation and plan. I have also made any necessary editorial changes.  Aletha Halim, MD 09/08/2021 3:28 PM

## 2021-09-10 ENCOUNTER — Inpatient Hospital Stay (HOSPITAL_COMMUNITY)
Admission: AD | Admit: 2021-09-10 | Discharge: 2021-09-10 | Disposition: A | Discharge status: Home / Self Care | Payer: Medicaid Other | Attending: Obstetrics & Gynecology | Admitting: Obstetrics & Gynecology

## 2021-09-10 DIAGNOSIS — O26891 Other specified pregnancy related conditions, first trimester: Secondary | ICD-10-CM

## 2021-09-10 DIAGNOSIS — O98811 Other maternal infectious and parasitic diseases complicating pregnancy, first trimester: Secondary | ICD-10-CM | POA: Insufficient documentation

## 2021-09-10 DIAGNOSIS — N898 Other specified noninflammatory disorders of vagina: Secondary | ICD-10-CM | POA: Diagnosis not present

## 2021-09-10 DIAGNOSIS — Z3A01 Less than 8 weeks gestation of pregnancy: Secondary | ICD-10-CM | POA: Diagnosis not present

## 2021-09-10 DIAGNOSIS — B3731 Acute candidiasis of vulva and vagina: Secondary | ICD-10-CM | POA: Insufficient documentation

## 2021-09-10 LAB — URINALYSIS, ROUTINE W REFLEX MICROSCOPIC
Bacteria, UA: NONE SEEN
Bilirubin Urine: NEGATIVE
Glucose, UA: NEGATIVE mg/dL
Ketones, ur: NEGATIVE mg/dL
Leukocytes,Ua: NEGATIVE
Nitrite: NEGATIVE
Protein, ur: NEGATIVE mg/dL
Specific Gravity, Urine: 1.003 — ABNORMAL LOW (ref 1.005–1.030)
pH: 6 (ref 5.0–8.0)

## 2021-09-10 MED ORDER — MICONAZOLE NITRATE 2 % VA CREA: 1.0000 | TOPICAL_CREAM | Freq: Every day | VAGINAL | 0 refills | Status: DC

## 2021-09-10 NOTE — MAU Provider Note (Signed)
History     [id number]  Arrival date and time: 09/10/21 1710    Chief Complaint  Patient presents with   Vaginal Discharge     HPI Ana Watson is a 31 y.o. at [redacted]w[redacted]d by LMP, who presents for brown discharge.   Patient seen last week in MAU for PUL workup Had appropriately rising hcg on multiple checks, nothing seen on [place], last hcg was 346 Also diagnosed with BV and given rx for metrogel  Today returns reporting brownish, clumpy discharge that started earlier today Endorses having some vaginal discomfort as well No burning or pain with urination No bleeding Has ongoing cramps similar to a period that she has had since her last MAU visit Worried that this discharge could be indicative of a miscarriage Feels similar to prior yeast infections though   --/--/O POS Performed at [facility], [address], [place], [place] [address]  (01/06 2109)  OB History     Gravida  1   Para      Term      Preterm      AB      Living         SAB      IAB      Ectopic      Multiple      Live Births              No past medical history on file.  No past surgical history on file.  No family history on file.  Social History   Socioeconomic History   Marital status: Single    Spouse name: Not on file   Number of children: Not on file   Years of education: Not on file   Highest education level: Not on file  Occupational History   Not on file  Tobacco Use   Smoking status: Every Day    Packs/day: 0.50    Years: 6.00    Pack years: 3.00    Types: Cigarettes   Smokeless tobacco: Never  Vaping Use   Vaping Use: Never used  Substance and Sexual Activity   Alcohol use: Yes    Comment: occasional   Drug use: Yes    Types: Marijuana   Sexual activity: Not on file  Other Topics Concern   Not on file  Social History Narrative   Not on file   Social Determinants of Health   Financial Resource Strain: Not on file  Food Insecurity: Food  Insecurity Present   Worried About [place] in the Last Year: Sometimes true   Ran Out of Food in the Last Year: Sometimes true  Transportation Needs: No Transportation Needs   Lack of Transportation (Medical): No   Lack of Transportation (Non-Medical): No  Physical Activity: Not on file  Stress: Not on file  Social Connections: Not on file  Intimate Partner Violence: Not on file    Allergies  Allergen Reactions   Percocet [Oxycodone-Acetaminophen] Nausea And Vomiting    No current facility-administered medications on file prior to encounter.   Current Outpatient Medications on File Prior to Encounter  Medication Sig Dispense Refill   fluticasone (FLONASE) 50 MCG/ACT nasal spray SMARTSIG:2 Puff(s) Both Nares Every Night     metroNIDAZOLE (METROGEL VAGINAL) 0.75 % vaginal gel Place 1 Applicatorful vaginally at bedtime. Insert one applicator, at bedtime, for 5 nights. 70 g 0   Prenatal Vit-Fe Fumarate-FA (PREPLUS) 27-1 MG TABS Take 1 tablet by mouth daily. 60 tablet  6     ROS Pertinent positives and negative per HPI, all others reviewed and negative  Physical Exam   BP 123/67    Pulse 84    Temp 98.4 F (36.9 C)    Resp 18    Ht 5\' 4"  (1.626 m)    Wt 112.5 kg    LMP 07/30/2021 (Approximate)    BMI 42.57 kg/m   Patient Vitals for the past 24 hrs:  BP Temp Pulse Resp Height Weight  09/10/21 1810 123/67 98.4 F (36.9 C) 84 18 5\' 4"  (1.626 m) 112.5 kg    Physical Exam Vitals reviewed.  Constitutional:      General: She is not in acute distress.    Appearance: She is well-developed. She is not diaphoretic.  Eyes:     General: No scleral icterus. Pulmonary:     Effort: Pulmonary effort is normal. No respiratory distress.  Skin:    General: Skin is warm and dry.  Neurological:     Mental Status: She is alert.     Coordination: Coordination normal.     Cervical Exam    Bedside Ultrasound Not done  My interpretation: n/a   Labs No results found for  this or any previous visit (from the past 24 hour(s)).  Imaging No results found.  MAU Course  Procedures  Lab Orders         Urinalysis, Routine w reflex microscopic Urine, Clean Catch    Meds ordered this encounter  Medications   miconazole (MONISTAT 7) 2 % vaginal cream    Sig: Place 1 Applicatorful vaginally at bedtime for 7 days.    Dispense:  45 g    Refill:  0   Imaging Orders  No imaging studies ordered today    MDM Minimal  Assessment and Plan  #Vaginal discharge in pregnancy, first trimester #Yeast vaginitis #[redacted] weeks gestation of pregnancy Reassured patient that symptoms unlikely to be indicative of a miscarriage. More likely residue from metrogel vs yeast vaginitis. Will treat empirically for yeast with topical therapy. To return to MAU if she has heavy bleeding or crescendo abdominal pain.    Dispo: discharged to home in stable condition.   Clarnce Flock, MD/MPH 09/10/21 6:31 PM  Allergies as of 09/10/2021       Reactions   Percocet [oxycodone-acetaminophen] Nausea And Vomiting        Medication List     TAKE these medications    fluticasone 50 MCG/ACT nasal spray Commonly known as: FLONASE SMARTSIG:2 Puff(s) Both Nares Every Night   metroNIDAZOLE 0.75 % vaginal gel Commonly known as: METROGEL VAGINAL Place 1 Applicatorful vaginally at bedtime. Insert one applicator, at bedtime, for 5 nights.   miconazole 2 % vaginal cream Commonly known as: MONISTAT 7 Place 1 Applicatorful vaginally at bedtime for 7 days.   PrePLUS 27-1 MG Tabs Take 1 tablet by mouth daily.

## 2021-09-10 NOTE — MAU Note (Signed)
Pt reports brown vag discharge this morning when she wiped. On vaginal medication for bv. C/o some mild cramping as well.

## 2021-09-16 ENCOUNTER — Inpatient Hospital Stay (HOSPITAL_COMMUNITY)
Admission: AD | Admit: 2021-09-16 | Discharge: 2021-09-16 | Disposition: A | Discharge status: Home / Self Care | Payer: Medicaid Other | Attending: Obstetrics & Gynecology | Admitting: Obstetrics & Gynecology

## 2021-09-16 ENCOUNTER — Other Ambulatory Visit: Payer: Self-pay

## 2021-09-16 ENCOUNTER — Inpatient Hospital Stay (HOSPITAL_COMMUNITY): Payer: Medicaid Other

## 2021-09-16 ENCOUNTER — Encounter (HOSPITAL_COMMUNITY): Payer: Self-pay | Admitting: Obstetrics & Gynecology

## 2021-09-16 DIAGNOSIS — O3680X0 Pregnancy with inconclusive fetal viability, not applicable or unspecified: Secondary | ICD-10-CM | POA: Diagnosis not present

## 2021-09-16 DIAGNOSIS — O99331 Smoking (tobacco) complicating pregnancy, first trimester: Secondary | ICD-10-CM | POA: Diagnosis not present

## 2021-09-16 DIAGNOSIS — Z3A01 Less than 8 weeks gestation of pregnancy: Secondary | ICD-10-CM | POA: Insufficient documentation

## 2021-09-16 DIAGNOSIS — O26851 Spotting complicating pregnancy, first trimester: Secondary | ICD-10-CM | POA: Insufficient documentation

## 2021-09-16 DIAGNOSIS — F17210 Nicotine dependence, cigarettes, uncomplicated: Secondary | ICD-10-CM | POA: Insufficient documentation

## 2021-09-16 DIAGNOSIS — O209 Hemorrhage in early pregnancy, unspecified: Secondary | ICD-10-CM

## 2021-09-16 DIAGNOSIS — O26891 Other specified pregnancy related conditions, first trimester: Secondary | ICD-10-CM | POA: Diagnosis present

## 2021-09-16 HISTORY — DX: Anxiety disorder, unspecified: F41.9

## 2021-09-16 LAB — URINALYSIS, ROUTINE W REFLEX MICROSCOPIC
Bilirubin Urine: NEGATIVE
Glucose, UA: NEGATIVE mg/dL
Ketones, ur: NEGATIVE mg/dL
Leukocytes,Ua: NEGATIVE
Nitrite: NEGATIVE
Protein, ur: NEGATIVE mg/dL
Specific Gravity, Urine: 1.015 (ref 1.005–1.030)
pH: 6 (ref 5.0–8.0)

## 2021-09-16 LAB — HCG, QUANTITATIVE, PREGNANCY: hCG, Beta Chain, Quant, S: 1770 m[IU]/mL — ABNORMAL HIGH (ref <5)

## 2021-09-16 LAB — URINALYSIS, MICROSCOPIC (REFLEX)

## 2021-09-16 LAB — COMPREHENSIVE METABOLIC PANEL
ALT: 42 U/L (ref 0–44)
AST: 34 U/L (ref 15–41)
Albumin: 3.8 g/dL (ref 3.5–5.0)
Alkaline Phosphatase: 72 U/L (ref 38–126)
Anion gap: 7 (ref 5–15)
BUN: 7 mg/dL (ref 6–20)
CO2: 22 mmol/L (ref 22–32)
Calcium: 9.3 mg/dL (ref 8.9–10.3)
Chloride: 103 mmol/L (ref 98–111)
Creatinine, Ser: 0.75 mg/dL (ref 0.44–1.00)
GFR, Estimated: >60 mL/min (ref >60)
Glucose, Bld: 95 mg/dL (ref 70–99)
Potassium: 3.6 mmol/L (ref 3.5–5.1)
Sodium: 132 mmol/L — ABNORMAL LOW (ref 135–145)
Total Bilirubin: 0.7 mg/dL (ref 0.3–1.2)
Total Protein: 7.6 g/dL (ref 6.5–8.1)

## 2021-09-16 NOTE — MAU Provider Note (Signed)
History     CSN: [id number]  Arrival date and time: 09/16/21 1518   Event Date/Time   First Provider Initiated Contact with Patient 09/16/21 1601      Chief Complaint  Patient presents with   Vaginal Bleeding   HPI Ana Watson is a 31 y.o. G1P0 at [redacted]w[redacted]d who presents with vaginal bleeding. Has had brown spotting for the last week. This morning the spotting is bright red. Only sees when she wipes. Not bleeding into a pad or passing clots. Denies abdominal pain, vaginal discharge, vaginal irritation, or dysuria. Last intercourse was yesterday.   OB History     Gravida  1   Para      Term      Preterm      AB      Living         SAB      IAB      Ectopic      Multiple      Live Births              Past Medical History:  Diagnosis Date   Anxiety     Past Surgical History:  Procedure Laterality Date   BREAST CYST EXCISION     FINGER DEBRIDEMENT     WISDOM TOOTH EXTRACTION      No family history on file.  Social History   Tobacco Use   Smoking status: Every Day    Packs/day: 0.50    Years: 6.00    Pack years: 3.00    Types: Cigarettes   Smokeless tobacco: Never  Vaping Use   Vaping Use: Never used  Substance Use Topics   Alcohol use: Not Currently    Comment: occasional   Drug use: Yes    Types: Marijuana    Comment: last smoked 09/09/2021    Allergies:  Allergies  Allergen Reactions   Percocet [Oxycodone-Acetaminophen] Nausea And Vomiting    Medications Prior to Admission  Medication Sig Dispense Refill Last Dose   metroNIDAZOLE (METROGEL VAGINAL) 0.75 % vaginal gel Place 1 Applicatorful vaginally at bedtime. Insert one applicator, at bedtime, for 5 nights. 70 g 0 Past Week   Prenatal Vit-Fe Fumarate-FA (PREPLUS) 27-1 MG TABS Take 1 tablet by mouth daily. 60 tablet 6 09/16/2021   fluticasone (FLONASE) 50 MCG/ACT nasal spray SMARTSIG:2 Puff(s) Both Nares Every Night      miconazole (MONISTAT 7) 2 % vaginal cream Place 1  Applicatorful vaginally at bedtime for 7 days. 45 g 0     Review of Systems  Constitutional: Negative.   Gastrointestinal: Negative.   Genitourinary:  Positive for vaginal bleeding. Negative for dysuria and vaginal discharge.  Physical Exam   Blood pressure 129/75, pulse 88, temperature 98.5 F (36.9 C), temperature source Oral, resp. rate 20, height 5\' 4"  (1.626 m), weight 112.7 kg, last menstrual period 07/30/2021, SpO2 100 %.  Physical Exam Vitals and nursing note reviewed.  Constitutional:      General: She is not in acute distress.    Appearance: Normal appearance.  HENT:     Head: Normocephalic and atraumatic.  Eyes:     General: No scleral icterus.    Conjunctiva/sclera: Conjunctivae normal.  Pulmonary:     Effort: Pulmonary effort is normal.  Abdominal:     Palpations: Abdomen is soft.     Tenderness: There is no abdominal tenderness. There is no guarding or rebound.  Skin:    General: Skin is warm and dry.  Neurological:  General: No focal deficit present.     Mental Status: She is alert.  Psychiatric:        Mood and Affect: Mood normal.        Behavior: Behavior normal.    MAU Course  Procedures Results for orders placed or performed during the hospital encounter of 09/16/21 (from the past 24 hour(s))  Urinalysis, Routine w reflex microscopic Urine, Clean Catch     Status: Abnormal   Collection Time: 09/16/21  3:37 PM  Result Value Ref Range   Color, Urine YELLOW YELLOW   APPearance CLEAR CLEAR   Specific Gravity, Urine 1.015 1.005 - 1.030   pH 6.0 5.0 - 8.0   Glucose, UA NEGATIVE NEGATIVE mg/dL   Hgb urine dipstick LARGE (A) NEGATIVE   Bilirubin Urine NEGATIVE NEGATIVE   Ketones, ur NEGATIVE NEGATIVE mg/dL   Protein, ur NEGATIVE NEGATIVE mg/dL   Nitrite NEGATIVE NEGATIVE   Leukocytes,Ua NEGATIVE NEGATIVE  Urinalysis, Microscopic (reflex)     Status: Abnormal   Collection Time: 09/16/21  3:37 PM  Result Value Ref Range   RBC / HPF 0-5 0 - 5  RBC/hpf   WBC, UA 0-5 0 - 5 WBC/hpf   Bacteria, UA RARE (A) NONE SEEN   Squamous Epithelial / LPF 0-5 0 - 5  hCG, quantitative, pregnancy     Status: Abnormal   Collection Time: 09/16/21  4:13 PM  Result Value Ref Range   hCG, Beta Chain, Quant, S 1,770 (H) <5 mIU/mL  Comprehensive metabolic panel     Status: Abnormal   Collection Time: 09/16/21  4:36 PM  Result Value Ref Range   Sodium 132 (L) 135 - 145 mmol/L   Potassium 3.6 3.5 - 5.1 mmol/L   Chloride 103 98 - 111 mmol/L   CO2 22 22 - 32 mmol/L   Glucose, Bld 95 70 - 99 mg/dL   BUN 7 6 - 20 mg/dL   Creatinine, Ser 0.75 0.44 - 1.00 mg/dL   Calcium 9.3 8.9 - 10.3 mg/dL   Total Protein 7.6 6.5 - 8.1 g/dL   Albumin 3.8 3.5 - 5.0 g/dL   AST 34 15 - 41 U/L   ALT 42 0 - 44 U/L   Alkaline Phosphatase 72 38 - 126 U/L   Total Bilirubin 0.7 0.3 - 1.2 mg/dL   GFR, Estimated >60 >60 mL/min   Anion gap 7 5 - 15   US OB Transvaginal  Result Date: 09/16/2021 CLINICAL DATA:  Vaginal bleeding, pregnant, quantitative beta hCG pending today, previously 346 on 09/07/2021. EXAM: TRANSVAGINAL OB ULTRASOUND TECHNIQUE: Transvaginal ultrasound was performed for complete evaluation of the gestation as well as the maternal uterus, adnexal regions, and pelvic cul-de-sac. COMPARISON:  09/04/2021 FINDINGS: Intrauterine gestational sac: None Yolk sac:  Not Visualized. Embryo:  Not Visualized. Maternal uterus/adnexae: Within the left adnexa there is a 15 x 11 by 12 mm thick walled cystic structure highly concerning for ectopic pregnancy, given the lack of documented IUP. No evidence of yolk sac or fetal pole identified however. No free fluid or evidence of rupture. The left ovary measures 3.0 x 1.9 x 2.0 cm and the right ovary measures 2.0 x 1.3 x 1.4 cm. The uterus is anteverted. Endometrium is thickened measuring up to 2.2 cm. IMPRESSION: 1. No evidence of intrauterine pregnancy on this exam. 2. Findings concerning for ectopic pregnancy with gestational sac in the  left adnexa, mean sac diameter proximally 13 mm. No yolk sac or fetal pole at this time. Please  correlate with quantitative beta HCG level. Critical Value/emergent results were called by telephone at the time of interpretation on 09/16/2021 at 4:51 pm to provider Jorje Guild , who verbally acknowledged these results. Electronically Signed   By: Randa Ngo M.D.   On: 09/16/2021 16:51    MDM Patient came in for vaginal spotting since intercourse last night. Denies abdominal pain. She is RH positive.  Had appropriately rising hcgs earlier in the pregnancy but nothing seen on ultrasound - was schedule for f/u viability scan.   Ultrasound today shows no IUP. There is a left adnexal mass measuring 15 mm that is concerning for ectopic pregnancy. There is no free fluid or signs of rupture. HCG today is 1770.   Reviewed results with Dr. Elgie Congo. Clinical picture today is concerning for ectopic pregnancy especially with nothing seen in the uterus. Can offer methotrexate vs close f/u with labs & imaging on Friday.   Extensive discussion with patient & significant other. Discussed imaging highly concerning for ectopic pregnancy. Reviewed risks of methotrexate to treat suspected ectopic pregnancy vs risks of no treatment with close follow up. This is a much desired pregnancy. Patient is stable with benign abdominal exam, normal vital signs, and normal hemoglobin. Using shared decision making, patient has opted to return to MAU on Friday for repeat HCG & ultrasound. Discussed concerning signs that should prompt emergent return to MAU.   Assessment and Plan   1. Pregnancy of unknown anatomic location   2. [redacted] weeks gestation of pregnancy   3. Vaginal bleeding in pregnancy, first trimester    -return to MAU Friday for stat HCG & ultrasound -strict return precautions related to ectopic pregnancy  Jorje Guild 09/16/2021, 6:54 PM

## 2021-09-16 NOTE — MAU Note (Signed)
Patient states the one blood clot she passed was the size of a pepper flake.

## 2021-09-16 NOTE — Discharge Instructions (Signed)
Return to care  If you have heavier bleeding that soaks through more than 2 pads per hour for an hour or more If you bleed so much that you feel like you might pass out or you do pass out If you have significant abdominal pain that is not improved with Tylenol   

## 2021-09-16 NOTE — MAU Note (Signed)
Presents with c/o VB with wiping.  States blood is bright red, passed 1 small clot.  Denies abdominal pain/cramping, report heaviness in pelvic region.  Reports frequent urination and urgency.  Denies dysuria.

## 2021-09-18 ENCOUNTER — Inpatient Hospital Stay (HOSPITAL_COMMUNITY)
Admission: AD | Admit: 2021-09-18 | Discharge: 2021-09-18 | Disposition: A | Discharge status: Home / Self Care | Payer: Medicaid Other | Attending: Obstetrics & Gynecology | Admitting: Obstetrics & Gynecology

## 2021-09-18 ENCOUNTER — Ambulatory Visit (HOSPITAL_COMMUNITY)
Admit: 2021-09-18 | Discharge: 2021-09-18 | Disposition: A | Discharge status: Home / Self Care | Payer: Medicaid Other | Attending: Obstetrics and Gynecology | Admitting: Obstetrics and Gynecology

## 2021-09-18 ENCOUNTER — Inpatient Hospital Stay (HOSPITAL_COMMUNITY): Payer: Medicaid Other

## 2021-09-18 ENCOUNTER — Other Ambulatory Visit: Payer: Self-pay

## 2021-09-18 DIAGNOSIS — Z3A01 Less than 8 weeks gestation of pregnancy: Secondary | ICD-10-CM

## 2021-09-18 DIAGNOSIS — O26891 Other specified pregnancy related conditions, first trimester: Secondary | ICD-10-CM | POA: Diagnosis not present

## 2021-09-18 DIAGNOSIS — O00102 Left tubal pregnancy without intrauterine pregnancy: Secondary | ICD-10-CM

## 2021-09-18 DIAGNOSIS — R9389 Abnormal findings on diagnostic imaging of other specified body structures: Secondary | ICD-10-CM | POA: Insufficient documentation

## 2021-09-18 LAB — TYPE AND SCREEN
ABO/RH(D): O POS
Antibody Screen: NEGATIVE

## 2021-09-18 LAB — COMPREHENSIVE METABOLIC PANEL
ALT: 40 U/L (ref 0–44)
AST: 28 U/L (ref 15–41)
Albumin: 3.6 g/dL (ref 3.5–5.0)
Alkaline Phosphatase: 63 U/L (ref 38–126)
Anion gap: 6 (ref 5–15)
BUN: 7 mg/dL (ref 6–20)
CO2: 24 mmol/L (ref 22–32)
Calcium: 9.5 mg/dL (ref 8.9–10.3)
Chloride: 104 mmol/L (ref 98–111)
Creatinine, Ser: 0.82 mg/dL (ref 0.44–1.00)
GFR, Estimated: >60 mL/min (ref >60)
Glucose, Bld: 159 mg/dL — ABNORMAL HIGH (ref 70–99)
Potassium: 3.7 mmol/L (ref 3.5–5.1)
Sodium: 134 mmol/L — ABNORMAL LOW (ref 135–145)
Total Bilirubin: 0.2 mg/dL — ABNORMAL LOW (ref 0.3–1.2)
Total Protein: 7 g/dL (ref 6.5–8.1)

## 2021-09-18 LAB — CBC
HCT: 36.8 % (ref 36.0–46.0)
Hemoglobin: 11.8 g/dL — ABNORMAL LOW (ref 12.0–15.0)
MCH: 28 pg (ref 26.0–34.0)
MCHC: 32.1 g/dL (ref 30.0–36.0)
MCV: 87.2 fL (ref 80.0–100.0)
Platelets: 283 10*3/uL (ref 150–400)
RBC: 4.22 MIL/uL (ref 3.87–5.11)
RDW: 12.2 % (ref 11.5–15.5)
WBC: 6.5 10*3/uL (ref 4.0–10.5)
nRBC: 0 % (ref 0.0–0.2)

## 2021-09-18 LAB — HCG, QUANTITATIVE, PREGNANCY: hCG, Beta Chain, Quant, S: 2348 m[IU]/mL — ABNORMAL HIGH (ref <5)

## 2021-09-18 NOTE — MAU Provider Note (Signed)
History   Chief Complaint:  Ectopic Follow up   Ana Watson is  31 y.o. G1P0 Patient's last menstrual period was 07/30/2021 (approximate).. Patient is here for follow up of quantitative HCG and ongoing surveillance of pregnancy status. She is [redacted]w[redacted]d weeks gestation  by LMP.    Since her last visit, the patient is without new complaint. The patient reports bleeding as  none now.  She denies any pain.  General ROS:  negative  Her previous Quantitative HCG values are:  1/5: 70.6 1/6: 138 1/9: 346 1/18: 1770  Physical Exam   Blood pressure 131/84, pulse 82, temperature 98.7 F (37.1 C), temperature source Oral, resp. rate 18, height 5\' 4"  (1.626 m), weight 112.9 kg, last menstrual period 07/30/2021, SpO2 100 %.  Physical Exam Vitals and nursing note reviewed.  Constitutional:      General: She is not in acute distress.    Appearance: She is well-developed.  HENT:     Head: Normocephalic.  Eyes:     Pupils: Pupils are equal, round, and reactive to light.  Cardiovascular:     Rate and Rhythm: Normal rate and regular rhythm.  Pulmonary:     Effort: Pulmonary effort is normal. No respiratory distress.     Breath sounds: Normal breath sounds.  Abdominal:     Palpations: Abdomen is soft.     Tenderness: There is no abdominal tenderness.  Musculoskeletal:        General: Normal range of motion.     Cervical back: Normal range of motion.  Skin:    General: Skin is warm and dry.  Neurological:     Mental Status: She is alert and oriented to person, place, and time.  Psychiatric:        Behavior: Behavior normal.        Thought Content: Thought content normal.        Judgment: Judgment normal.     Labs: Results for orders placed or performed during the hospital encounter of 09/18/21 (from the past 24 hour(s))  Type and screen [place]   Collection Time: 09/18/21  4:06 PM  Result Value Ref Range   ABO/RH(D) O POS    Antibody Screen NEG    Sample  Expiration      09/21/2021,2359 Performed at [facility], [place] [address] [place], [place] [address]   CBC   Collection Time: 09/18/21  4:09 PM  Result Value Ref Range   WBC 6.5 4.0 - 10.5 K/uL   RBC 4.22 3.87 - 5.11 MIL/uL   Hemoglobin 11.8 (L) 12.0 - 15.0 g/dL   HCT 36.8 36.0 - 46.0 %   MCV 87.2 80.0 - 100.0 fL   MCH 28.0 26.0 - 34.0 pg   MCHC 32.1 30.0 - 36.0 g/dL   RDW 12.2 11.5 - 15.5 %   Platelets 283 150 - 400 K/uL   nRBC 0.0 0.0 - 0.2 %  Comprehensive metabolic panel   Collection Time: 09/18/21  4:09 PM  Result Value Ref Range   Sodium 134 (L) 135 - 145 mmol/L   Potassium 3.7 3.5 - 5.1 mmol/L   Chloride 104 98 - 111 mmol/L   CO2 24 22 - 32 mmol/L   Glucose, Bld 159 (H) 70 - 99 mg/dL   BUN 7 6 - 20 mg/dL   Creatinine, Ser 0.82 0.44 - 1.00 mg/dL   Calcium 9.5 8.9 - 10.3 mg/dL   Total Protein 7.0 6.5 - 8.1 g/dL   Albumin  3.6 3.5 - 5.0 g/dL   AST 28 15 - 41 U/L   ALT 40 0 - 44 U/L   Alkaline Phosphatase 63 38 - 126 U/L   Total Bilirubin 0.2 (L) 0.3 - 1.2 mg/dL   GFR, Estimated >60 >60 mL/min   Anion gap 6 5 - 15  hCG, quantitative, pregnancy   Collection Time: 09/18/21  4:09 PM  Result Value Ref Range   hCG, Beta Chain, Quant, S 2,348 (H) <5 mIU/mL    Ultrasound Studies:   US OB Transvaginal  Result Date: 09/18/2021 CLINICAL DATA:  Suspected ectopic EXAM: TRANSVAGINAL OB ULTRASOUND TECHNIQUE: Transvaginal ultrasound was performed for complete evaluation of the gestation as well as the maternal uterus, adnexal regions, and pelvic cul-de-sac. COMPARISON:  09/16/2021, 09/04/2021 FINDINGS: Intrauterine gestational sac: Not seen Yolk sac:  Not seen Embryo:  Not seen Maternal uterus/adnexae: Heterogenous endometrial thickening. Right ovary measures 3.1 by 2 x 1.4 cm. The left ovary measures 1.9 by 3.3 x 1.9 cm. In the left adnexa adjacent to the left ovary is a cystic structure measuring 0.8 x 0.5 x 0.5 cm. There is internal ring like structure suspicious for small  yolk sac. If the thick echogenic ring peripheral to the cystic structure is included in the measurement, this measures 15 by 12 by 11 mm (my measurement). There is no significant free fluid. IMPRESSION: 1. Cystic structure in the left adnexa separate from left ovary, now with small internal ring-like structure suggestive of yolk sac; findings would be consistent with left adnexal ectopic pregnancy. Size grossly stable. No evidence for free fluid or hemoperitoneum. 2. No IUP identified. Critical Value/emergent results were called by telephone at the time of interpretation on 09/18/2021 at 5:49 pm to provider Ayssa Bentivegna , who verbally acknowledged these results. Electronically Signed   By: Donavan Foil M.D.   On: 09/18/2021 17:49     Assessment:   1. Left tubal pregnancy without intrauterine pregnancy   2. [redacted] weeks gestation of pregnancy    -Results reviewed with patient at length. Patient very tearful and upset at discussed news. Partner at bedside and supportive of patient. Patient with many questions regarding rising HCG and course of pregnancy. Reports this is a very desired pregnancy and she is very upset at this news.   CNM discussed progression of pregnancy and diagnosis of ectopic based on ultrasound images. Ultrasound images reviewed with patient and partner. Discussed that ectopic pregnancies are not viable and cannot be moved or reimplanted anywhere. Discussed that at this time, without symptoms she is clinically stable and Dr. Elonda Husky recommends methotrexate treatment. Discussed with patient that ectopic pregnancies are life threatening if they rupture and cause internal bleeding and CNM is unable to tell patient when that may happen in her care. Patient requested time to discuss with partner.   Upon rediscussion, patient is tearful and states she feels like she needs a second opinion. CNM verbalized understanding of feelings. CNM discussed ectopic pregnancies again at length and that  methotrexate therapy is standard of care in this situation. Patient had questions about impact on future fertility. CNM discussed with patient that she may have residual scar tissue from this ectopic and is at risk for future ectopics but that does not mean that patient could not carry a normal pregnancy. Patient asked about other options for treatment. Discussed that surgery is another option but typically reserved for cases where the ectopic has ruptured, is larger in size, or there are contraindications for methotrexate therapy because  there is a higher risk that patient may need salpingectomy or oophorectomy. Patient verbalized understanding and states that she does not want surgery. Patient states she is scheduled for a repeat ultrasound on 1/26 and asked if she could wait for that repeat scan before treatment. CNM informed patient that this was not a safe plan due to potential risk of rupture, inability to predict when that may happen and risk of maternal death if this occurs. Patient verbalized understanding and requested more time to think.  CNM back at bedside and patient states they are leaving to go to [facility] for a second opinion. CNM emphasized risk to patient of untreated ectopic pregnancy and patient verbalized understanding stating that they are going straight to [facility] for evaluation now.    Dr. Elonda Husky notified that patient has left against our recommendations.    Plan: -Patient left AMA for second opinion.  Wende Mott, CNM 09/18/2021, 6:29 PM

## 2021-09-18 NOTE — MAU Note (Signed)
Presents for follow up to rule out ectopic pregnancy.  Denies VB and pain.

## 2021-09-22 ENCOUNTER — Other Ambulatory Visit: Payer: Self-pay

## 2021-09-22 ENCOUNTER — Ambulatory Visit (INDEPENDENT_AMBULATORY_CARE_PROVIDER_SITE_OTHER): Payer: Medicaid Other

## 2021-09-22 VITALS — BP 118/70 | HR 79 | Wt 248.8 lb

## 2021-09-22 DIAGNOSIS — O3680X0 Pregnancy with inconclusive fetal viability, not applicable or unspecified: Secondary | ICD-10-CM

## 2021-09-22 DIAGNOSIS — O209 Hemorrhage in early pregnancy, unspecified: Secondary | ICD-10-CM

## 2021-09-22 LAB — BETA HCG QUANT (REF LAB): hCG Quant: 2425 m[IU]/mL

## 2021-09-22 NOTE — Progress Notes (Signed)
Patient seen today for stat hCG following up from visits to MAU starting on 09/16/21 for vaginal bleeding. Patient seen again at MAU on 09/18/21 and was notified of ectopic pregnancy. Patient received methotrexate on 09/19/21. Patient states today she is spotting but is not passing any clots vaginally. Patient states she is having mild abdominal cramping. Patient hCG on 09/19/21 was 2,650.7. Patient's stat hCG today was 2,425. Reviewed this with Dr. Dione Plover. Per Dr. Dione Plover these results show a stable hCG level for day 4 and that patient should have another stat hCG drawn on day 7. I called patient and reviewed these results with her. Explained to patient we will do another stat hCG on Friday. Patient denies any other questions or concerns.   Paulina Fusi, RN 09/22/21

## 2021-09-24 ENCOUNTER — Other Ambulatory Visit: Payer: Medicaid Other

## 2021-09-25 ENCOUNTER — Ambulatory Visit (INDEPENDENT_AMBULATORY_CARE_PROVIDER_SITE_OTHER): Payer: Medicaid Other

## 2021-09-25 ENCOUNTER — Other Ambulatory Visit: Payer: Self-pay

## 2021-09-25 VITALS — BP 104/60 | HR 90

## 2021-09-25 DIAGNOSIS — O00102 Left tubal pregnancy without intrauterine pregnancy: Secondary | ICD-10-CM

## 2021-09-25 LAB — BETA HCG QUANT (REF LAB): hCG Quant: 1710 m[IU]/mL

## 2021-09-25 NOTE — Progress Notes (Signed)
Beta HCG Follow-up Visit  Ana Watson presents to [facility] for follow-up beta HCG lab. She was seen in MAU for treatment of ectopic pregnancy with methotrexate on 09/18/21. Patient reports vaginal spotting today. Denies abdominal pain. Discussed with patient that we are following beta HCG levels today. I will call the patient with results.   Beta HCG results: 09/18/21 @ 6751 TWY 4 2998  09/22/21 @ 0908 DAY 4 2425  09/25/21 @ 0936 DAY 7 1710   Results and patient history reviewed with Dione Plover, MD, who states beta HCG is decreasing appropriately and patient should return for non stat beta HCG weekly until less than 5. Patient called and informed of plan for follow-up. Lab appt scheduled for 10/01/21 at 1030.  Annabell Howells 09/25/2021 9:46 AM

## 2021-09-30 ENCOUNTER — Other Ambulatory Visit: Payer: Self-pay

## 2021-09-30 DIAGNOSIS — O00102 Left tubal pregnancy without intrauterine pregnancy: Secondary | ICD-10-CM

## 2021-10-01 ENCOUNTER — Other Ambulatory Visit: Payer: Medicaid Other

## 2021-10-01 ENCOUNTER — Other Ambulatory Visit: Payer: Self-pay

## 2021-10-01 DIAGNOSIS — O00102 Left tubal pregnancy without intrauterine pregnancy: Secondary | ICD-10-CM

## 2021-10-02 ENCOUNTER — Telehealth: Payer: Self-pay | Admitting: General Practice

## 2021-10-02 DIAGNOSIS — O00102 Left tubal pregnancy without intrauterine pregnancy: Secondary | ICD-10-CM

## 2021-10-02 LAB — BETA HCG QUANT (REF LAB): hCG Quant: 1049 m[IU]/mL

## 2021-10-02 NOTE — Telephone Encounter (Signed)
Patient called and left message on nurse voicemail line requesting test results.   Called patient stating I am returning her phone call. Patient states she saw her results in mychart that her hormones were 1,049 but she had them checked earlier this week at her OB and it was 900 something. Patient wants to know why they are going up. Ask patient who her OB is and she states [facility]. Told patient there can be difference in the levels between different labs so the most accurate is the same lab. Told patient she doesn't need to follow both of [place] and she can just go see [facility] for follow up care. Also advised patient insurance likely wouldn't cover both locations/labs. Patient states she will just come here to [place] for follow up. Scheduled non stat bhcg for 2/9 and advised patient that results are not same day anymore and will result the next day. Patient verbalized understanding.

## 2021-10-08 ENCOUNTER — Other Ambulatory Visit: Payer: Self-pay

## 2021-10-08 ENCOUNTER — Other Ambulatory Visit: Payer: Medicaid Other

## 2021-10-08 DIAGNOSIS — O00102 Left tubal pregnancy without intrauterine pregnancy: Secondary | ICD-10-CM

## 2021-10-09 ENCOUNTER — Telehealth: Payer: Self-pay

## 2021-10-09 DIAGNOSIS — O039 Complete or unspecified spontaneous abortion without complication: Secondary | ICD-10-CM

## 2021-10-09 LAB — BETA HCG QUANT (REF LAB): hCG Quant: 555 m[IU]/mL

## 2021-10-09 NOTE — Telephone Encounter (Addendum)
-----   Message from Clarnce Flock, MD sent at 10/09/2021  8:08 AM EST ----- Downtrending appropriately Clinical staff please coordinate follow up hcg in one to two weeks, trend to 0  Notified pt results and the recommendation to trend levels to zero.  Pt agreed to scheduling of 10/15/21 @ 1100 arriving at 1030 for non stat beta.  Mel Almond, RN  10/09/21

## 2021-10-14 ENCOUNTER — Inpatient Hospital Stay (HOSPITAL_COMMUNITY)
Admission: AD | Admit: 2021-10-14 | Discharge: 2021-10-14 | Disposition: A | Discharge status: Home / Self Care | Payer: Medicaid Other | Attending: Obstetrics and Gynecology | Admitting: Obstetrics and Gynecology

## 2021-10-14 ENCOUNTER — Other Ambulatory Visit: Payer: Self-pay

## 2021-10-14 DIAGNOSIS — O00109 Unspecified tubal pregnancy without intrauterine pregnancy: Secondary | ICD-10-CM | POA: Insufficient documentation

## 2021-10-14 DIAGNOSIS — Z9221 Personal history of antineoplastic chemotherapy: Secondary | ICD-10-CM | POA: Diagnosis not present

## 2021-10-14 MED ORDER — METHOTREXATE FOR ECTOPIC PREGNANCY
50.0000 mg/m2 | Freq: Once | INTRAMUSCULAR | Status: AC
  Administered 2021-10-14: 112.5 mg via INTRAMUSCULAR
  Filled 2021-10-14: qty 10

## 2021-10-14 NOTE — MAU Provider Note (Signed)
Event Date/Time  First Provider Initiated Contact with Patient 10/14/21 1207      S Ms. Ana Watson is a 31 y.o. G1P0 patient who presents to MAU today for her second Methotrexate administration. She is without complaints. Patient is s/p Methotrexate administration for ectopic pregnancy at [facility] on 09/19/2021. Patient has presented to [facility] for followup lab work th and states she was reassured that her America Brown was dropping appropriately. She has an existing plan with [facility] for non-stat Quant hCG in the office tomorrow 10/15/2021. She had labs drawn and her chart was discussed with Dr. Dione Plover in the [facility] office on 10/09/2021.  Patient states she was evaluated by Dr. Philis Pique at [facility] yesterday 10/13/2021 and advised to receive a second dose of Methotrexate. Patient endorses having labs collected yesterday at [facility].   On arrival to MAU patient reports to CNM that she is unsure of which plan of care she plans to pursue. She also verbalizes that she is unsure about which practice she plans to commit to for ongoing care.  O BP 109/72 (BP Location: Right Arm)    Pulse 71    Temp 98.3 F (36.8 C)    Resp 18    Ht 5\' 4"  (1.626 m)    Wt 114.3 kg    LMP 07/30/2021 (Approximate)    SpO2 100%    BMI 43.26 kg/m    Physical Exam Vitals and nursing note reviewed. Exam conducted with a chaperone present.  Constitutional:      Appearance: She is not ill-appearing.  Cardiovascular:     Rate and Rhythm: Normal rate.  Pulmonary:     Effort: Pulmonary effort is normal.  Skin:    Capillary Refill: Capillary refill takes less than 2 seconds.  Neurological:     Mental Status: She is alert and oriented to person, place, and time.    A --Medical screening exam complete --Expressed concern that patient is essentially receiving care, having labs collected by two different practices sumultaneously --20 minutes at bedside with patient and partner discussing importance of choosing which OB/GYN  practice is providing care which will then dictate actions during MAU encounter.  --Patient given 30 min to discuss desired way forward with partner --At [place] returned to bedside and informed by patient she was planning to continue care with [facility] and desires Methotrexate   P Methotrexate managed by Dr. Carlis Abbott from office  F/U --Clarified with patient that she has been seen most recently by [facility], is choosing to pursue Methotrexate which is the plan of care developed with [facility]. She will therefore not continue follow up at [facility]. Patient verbalizes understanding and consents to CNM messaging [facility] to cancel tomorrow's appointment at [facility], [place] 10/14/2021 3:57 PM

## 2021-10-14 NOTE — MAU Note (Addendum)
States she was sent by OB to receive 2nd Methotrexate injection.  Reports doesn't want injection, prefers to keep f/u appt tomorrow [facility].

## 2021-10-15 ENCOUNTER — Other Ambulatory Visit: Payer: Medicaid Other

## 2021-10-28 ENCOUNTER — Encounter (HOSPITAL_COMMUNITY): Payer: Self-pay | Admitting: *Deleted

## 2021-10-28 ENCOUNTER — Emergency Department (HOSPITAL_COMMUNITY): Payer: Medicaid Other

## 2021-10-28 ENCOUNTER — Emergency Department (HOSPITAL_COMMUNITY)
Admission: EM | Admit: 2021-10-28 | Discharge: 2021-10-28 | Disposition: A | Discharge status: Home / Self Care | Payer: Medicaid Other | Attending: Emergency Medicine | Admitting: Emergency Medicine

## 2021-10-28 ENCOUNTER — Other Ambulatory Visit: Payer: Self-pay

## 2021-10-28 DIAGNOSIS — G8929 Other chronic pain: Secondary | ICD-10-CM

## 2021-10-28 DIAGNOSIS — R519 Headache, unspecified: Secondary | ICD-10-CM | POA: Diagnosis present

## 2021-10-28 NOTE — Discharge Instructions (Addendum)
As we discussed today your CT scan was read as possible mastoiditis which is inflammation of the mastoid.  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  If you develop painful swelling behind your ear, fevers or have any new or concerning symptoms please seek additional medical care and evaluation. ?Please restart using your nose spray.  From chart review it appears you are using Flonase.  If this was working for you then you can continue using it, however I generally recommend people try Nasacort first. ?Additionally please start taking either Allegra, Claritin, or Zyrtec every night. ? ?In addition to following up with your ENT, I would also recommend that you get your vision checked and follow-up with your dentist.  Please also mention your headaches to your OB/GYN during your next visit. ? ?

## 2021-10-28 NOTE — ED Triage Notes (Signed)
Left sided pain near temporal area with more consistency after receiving her 2nd Methotrexate injection. Last injection 2/15, feels off balance. No N/V ?

## 2021-10-28 NOTE — ED Provider Notes (Cosign Needed)
[facility] DEPT Provider Note   CSN: [id number] Arrival date & time: 10/28/21  2100     History  Chief Complaint  Patient presents with   Headache    Ana Watson is a 31 y.o. female who presents today for evaluation of a headache. She states that over the past 3 months she has had worsening and more frequent left temporal headaches.  She states that they will oftentimes happen in the morning however she is now having them throughout the day.  She denies any seizures.  She states that when the headache is bad she does feel somewhat off balance and the pain has occasionally been severe.  She denies any vision changes.  She does have a history of chronic bilateral a station tube dysfunction.  She denies any fevers.  She states that her symptoms are mostly a pressure.  She states that it is different from a migraine, notes she had migraines when she was younger however states this feels different.  Her pain waxes and wanes.   She states that she talked with her primary care doctor today, they are going to come do a home visit tomorrow however requested that she come to the emergency room for CT scan. Patient denies any fevers.  She denies any trauma prior to the onset of her symptoms.  She denies any chiropractic adjustments or manipulations.  She states she has not had her vision checked in many years.  She states she has not seen a dentist in "a while."  She recently had a pregnancy of unknown anatomic gestation which was treated with methotrexate however her symptoms started at at least a month before her pregnancy per her report.  TShe states that her OB/GYN has been doing weekly blood work.  She went yesterday however did not mention the headache to them per her report.    She used to be taking Flonase per recommendation of her ENT however states that she did in retrospect stop this before her symptoms had gotten worse in the past week or so.   HPI      Home Medications Prior to Admission medications   Medication Sig Start Date End Date Taking? Authorizing Provider  fluticasone (FLONASE) 50 MCG/ACT nasal spray SMARTSIG:2 Puff(s) Both Nares Every Night 05/19/21   [provider]      Allergies    Percocet [oxycodone-acetaminophen]    Review of Systems   Review of Systems  Physical Exam Updated Vital Signs BP (!) 145/90    Pulse 80    Temp 98 F (36.7 C)    Resp 16    Ht 5\' 4"  (1.626 m)    Wt 112.5 kg    LMP 07/30/2021 (Approximate)    SpO2 100%    Breastfeeding Unknown    BMI 42.57 kg/m  Physical Exam Vitals and nursing note reviewed.  Constitutional:      General: She is not in acute distress.    Appearance: She is not ill-appearing.  HENT:     Head: Normocephalic and atraumatic.     Comments: Generalized tenderness to palpation over the TMJ joint and the left anterior temporal area    Right Ear: Tympanic membrane, ear canal and external ear normal. No drainage, swelling or tenderness. No mastoid tenderness.     Left Ear: Tympanic membrane, ear canal and external ear normal. No drainage, swelling or tenderness. No mastoid tenderness.     Mouth/Throat:     Mouth: Mucous membranes are moist.  Pharynx: Oropharynx is clear.  Eyes:     General: No visual field deficit.    Extraocular Movements: Extraocular movements intact.     Conjunctiva/sclera: Conjunctivae normal.     Pupils: Pupils are equal, round, and reactive to light.  Cardiovascular:     Rate and Rhythm: Normal rate and regular rhythm.  Pulmonary:     Effort: Pulmonary effort is normal. No respiratory distress.  Abdominal:     General: There is no distension.  Musculoskeletal:     Cervical back: Normal range of motion and neck supple.     Comments: No obvious acute injury  Skin:    General: Skin is warm.  Neurological:     Mental Status: She is alert and oriented to person, place, and time.     Cranial Nerves: No cranial nerve deficit, dysarthria  or facial asymmetry.     Motor: No weakness.     Coordination: Coordination normal.     Gait: Gait normal.     Comments: Awake and alert, answers all questions appropriately.  Speech is not slurred.  Psychiatric:        Mood and Affect: Mood normal.        Speech: Speech normal.        Behavior: Behavior normal.    ED Results / Procedures / Treatments   Labs (all labs ordered are listed, but only abnormal results are displayed) Labs Reviewed - No data to display  EKG None  Radiology CT HEAD WO CONTRAST (5MM)  Result Date: 10/28/2021 CLINICAL DATA:  Headache, chronic, new features or increased frequency. Left-sided pain near temporal region. EXAM: CT HEAD WITHOUT CONTRAST TECHNIQUE: Contiguous axial images were obtained from the base of the skull through the vertex without intravenous contrast. RADIATION DOSE REDUCTION: This exam was performed according to the departmental dose-optimization program which includes automated exposure control, adjustment of the mA and/or kV according to patient size and/or use of iterative reconstruction technique. COMPARISON:  07/18/2019. FINDINGS: Brain: No evidence of acute infarction, hemorrhage, hydrocephalus, extra-axial collection or mass lesion/mass effect. There is a hypodense region in the temporal lobe on the right which is likely artifactual. Vascular: No hyperdense vessel or unexpected calcification. Skull: Normal. Negative for fracture or focal lesion. Sinuses/Orbits: No acute finding. Other: There is partial opacification of the mastoid air cells on the left. A few scattered opacities are noted in the mastoid air cells on the right. IMPRESSION: 1. No acute intracranial process. 2. Partial opacification of the mastoid air cells on the left and a few opacities in the mastoid air cells on the right, possible mastoiditis. Electronically Signed   By: Brett Fairy M.D.   On: 10/28/2021 22:16    Procedures Procedures    Medications Ordered in  ED Medications - No data to display  ED Course/ Medical Decision Making/ A&P Clinical Course as of 10/28/21 2356  Wed Oct 28, 2021  2234 CT HEAD WO CONTRAST (5MM) I spoke with radiologist.  Verl Blalock she does report that there may be some mastoiditis it is improved compared to the scan of 2020. [EH]    Clinical Course User Index [EH] Lorin Glass, PA-C                           Medical Decision Making Patient is a 31 year old woman who presents today for evaluation of multiple months of headache.  She states that her primary care doctor is going to see her tomorrow  however recommended that she come to the ER tonight to get a CT scan. On exam patient is neurovascularly intact.  She has tenderness over the left TMJ joint/temporal area.  Temporal arteritis is considered however given her age, and lack of vision changes despite symptoms for multiple months this is very unlikely. As patient has recently been going through a treatment of a pregnancy of unknown anatomic location with methotrexate I did offer her labs.  She states she saw her OB/GYN yesterday and they drew labs, her labs have been drawn weekly and have so far been normal and for now she declined waiting to see what her labs are anticipating she will get results tomorrow.  Patient states she did not mention the headache to her OB/GYN at the visit yesterday.  I discussed with her risks of CT scan, benefits and alternatives, she wishes for scan tonight. CT scan was ordered, see below for further details.  While mastoiditis was noted as a possible concern per radiology note patient does not have fevers, does not have any tenderness, bogginess or pain over her mastoid and has a history of eustachian tube dysfunction, I suspect that this is chronic rather than acute.  I discussed this finding with the patient and recommended that she follow-up with her ENT.  She does not have any fevers or other acute infectious signs at this  point.  Patient and I discussed her headaches at length.  In addition to following up with her primary care doctor I also recommended that she follow-up with her ENT, mention her headaches to her OB/GYN at her next visit, and see both a dentist for concern of TMJ dysfunction and get her vision checked.  Patient states her understanding of these instructions.  Care plan was discussed with patient and her significant other who is at bedside with patient's consent.    Amount and/or Complexity of Data Reviewed Independent Historian: spouse    Details: Significant other External Data Reviewed: notes.    Details: Most recent ENT visit Radiology: ordered. Decision-making details documented in ED Course. Discussion of management or test interpretation with external provider(s): I spoke with radiologist regarding her CT scan for further clarification.  The possible mastoiditis that was noted it appears to have improved from 2020.    Risk OTC drugs. Decision regarding hospitalization.   This patient was discussed with Dr. Almyra Free.   Return precautions were discussed with patient who states their understanding.  At the time of discharge patient denied any unaddressed complaints or concerns.  Patient is agreeable for discharge home.  Note: Portions of this report may have been transcribed using voice recognition software. Every effort was made to ensure accuracy; however, inadvertent computerized transcription errors may be present         Final Clinical Impression(s) / ED Diagnoses Final diagnoses:  Chronic nonintractable headache, unspecified headache type    Rx / DC Orders ED Discharge Orders     None         Lorin Glass, PA-C 10/28/21 2356

## 2022-04-19 ENCOUNTER — Encounter (HOSPITAL_COMMUNITY): Payer: Self-pay

## 2022-04-19 ENCOUNTER — Ambulatory Visit (HOSPITAL_COMMUNITY)
Admission: EM | Admit: 2022-04-19 | Discharge: 2022-04-19 | Disposition: A | Discharge status: Home / Self Care | Payer: Medicaid Other | Attending: Family Medicine | Admitting: Family Medicine

## 2022-04-19 DIAGNOSIS — L0231 Cutaneous abscess of buttock: Secondary | ICD-10-CM | POA: Diagnosis not present

## 2022-04-19 DIAGNOSIS — L0291 Cutaneous abscess, unspecified: Secondary | ICD-10-CM

## 2022-04-19 MED ORDER — PREDNISONE 20 MG PO TABS
40.0000 mg | ORAL_TABLET | Freq: Every day | ORAL | 0 refills | Status: AC
Start: 2022-04-19 — End: 2022-04-22

## 2022-04-19 MED ORDER — AMOXICILLIN-POT CLAVULANATE 875-125 MG PO TABS: 1.0000 | ORAL_TABLET | Freq: Two times a day (BID) | ORAL | 0 refills | Status: AC

## 2022-04-19 NOTE — Discharge Instructions (Addendum)
Take amoxicillin-clavulanate 875 mg--1 tab twice daily with food for 7 days  Take prednisone 20 mg--2 daily for 5 days

## 2022-04-19 NOTE — ED Provider Notes (Signed)
[facility]    CSN: [id number] Arrival date & time: 04/19/22  1722      History   Chief Complaint Chief Complaint  Patient presents with   Insect Bite   Abscess    HPI Ana Watson is a 31 y.o. female.    Abscess  Here for a 2 to 3-day history of a bump on her right buttock.  It got a little worse and then today it has drained.  She is very concerned it might be a spider bite.  No fever or chills  She is allergic to Percocet  Last menstrual cycle was in the last few days.  Past Medical History:  Diagnosis Date   Anxiety     Patient Active Problem List   Diagnosis Date Noted   Conductive hearing loss, bilateral 03/19/2021   Eustachian tube dysfunction, bilateral 03/19/2021    Past Surgical History:  Procedure Laterality Date   BREAST CYST EXCISION     FINGER DEBRIDEMENT     WISDOM TOOTH EXTRACTION      OB History     Gravida  1   Para      Term      Preterm      AB      Living         SAB      IAB      Ectopic      Multiple      Live Births               Home Medications    Prior to Admission medications   Medication Sig Start Date End Date Taking? Authorizing Provider  amoxicillin-clavulanate (AUGMENTIN) 875-125 MG tablet Take 1 tablet by mouth 2 (two) times daily for 7 days. 04/19/22 04/26/22 Yes Telesha Deguzman, Gwenlyn Perking, MD  predniSONE (DELTASONE) 20 MG tablet Take 2 tablets (40 mg total) by mouth daily with breakfast for 3 days. 04/19/22 04/22/22 Yes Barrett Henle, MD  fluticasone Asencion Islam) 50 MCG/ACT nasal spray SMARTSIG:2 Puff(s) Both Nares Every Night 05/19/21   [provider]    Family History No family history on file.  Social History Social History   Tobacco Use   Smoking status: Every Day    Packs/day: 0.50    Years: 6.00    Total pack years: 3.00    Types: Cigarettes   Smokeless tobacco: Never  Vaping Use   Vaping Use: Never used  Substance Use Topics   Alcohol use: Not Currently     Comment: occasional   Drug use: Yes    Types: Marijuana     Allergies   Percocet [oxycodone-acetaminophen]   Review of Systems Review of Systems   Physical Exam Triage Vital Signs ED Triage Vitals [04/19/22 1841]  Enc Vitals Group     BP      Pulse Rate 76     Resp 18     Temp 98.5 F (36.9 C)     Temp Source Oral     SpO2 100 %     Weight      Height      Head Circumference      Peak Flow      Pain Score      Pain Loc      Pain Edu?      Excl. in [place]?    No data found.  Updated Vital Signs Pulse 76   Temp 98.5 F (36.9 C) (Oral)   Resp 18   LMP  07/30/2021 (Approximate)   SpO2 100%   Visual Acuity Right Eye Distance:   Left Eye Distance:   Bilateral Distance:    Right Eye Near:   Left Eye Near:    Bilateral Near:     Physical Exam Vitals reviewed.  Constitutional:      General: She is not in acute distress.    Appearance: She is not ill-appearing, toxic-appearing or diaphoretic.  HENT:     Mouth/Throat:     Mouth: Mucous membranes are moist.  Skin:    Coloration: Skin is not pale.     Comments: On her right lateral buttock there is an area of mild induration and erythema about 2 x 1 cm.  In the center there is a little drain spot that has just a little yellow material there.  Neurological:     Mental Status: She is alert and oriented to person, place, and time.  Psychiatric:        Behavior: Behavior normal.      UC Treatments / Results  Labs (all labs ordered are listed, but only abnormal results are displayed) Labs Reviewed - No data to display  EKG   Radiology No results found.  Procedures Procedures (including critical care time)  Medications Ordered in UC Medications - No data to display  Initial Impression / Assessment and Plan / UC Course  I have reviewed the triage vital signs and the nursing notes.  Pertinent labs & imaging results that were available during my care of the patient were reviewed by me and considered in  my medical decision making (see chart for details).     Think this is most likely a simple abscess that is drained, but she is very concerned that it might be a spider bite, so I will also treat with prednisone for 3 days in addition to her antibiotics. Final Clinical Impressions(s) / UC Diagnoses   Final diagnoses:  Abscess     Discharge Instructions      Take amoxicillin-clavulanate 875 mg--1 tab twice daily with food for 7 days  Take prednisone 20 mg--2 daily for 5 days     ED Prescriptions     Medication Sig Dispense Auth. Provider   amoxicillin-clavulanate (AUGMENTIN) 875-125 MG tablet Take 1 tablet by mouth 2 (two) times daily for 7 days. 14 tablet Muskan Bolla, Gwenlyn Perking, MD   predniSONE (DELTASONE) 20 MG tablet Take 2 tablets (40 mg total) by mouth daily with breakfast for 3 days. 6 tablet Windy Carina Gwenlyn Perking, MD      PDMP not reviewed this encounter.   Barrett Henle, MD 04/19/22 [phone]

## 2022-04-19 NOTE — ED Triage Notes (Signed)
Pt presents to office for bump of buttocks.

## 2022-07-19 ENCOUNTER — Telehealth: Payer: Self-pay | Admitting: *Deleted

## 2022-07-19 IMAGING — CT CT HEAD W/O CM
3 series · 14 of 47 positions shown, 16 images · non-contrast
Comparison: 07/18/2019.

CLINICAL DATA: Headache, chronic, new features or increased
frequency. Left-sided pain near temporal region.



[Series 2: head wo · axial · 0.48mm/px · z∈[+1371,+1496]mm · 8 of 31 slices shown, 10 images]
[im 3/31  brain]
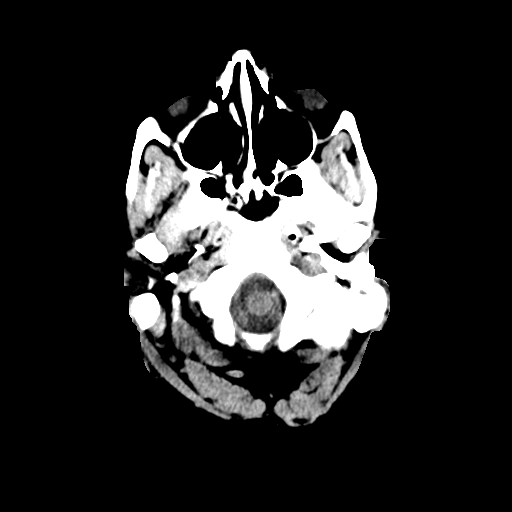
[im 3/31  bone]
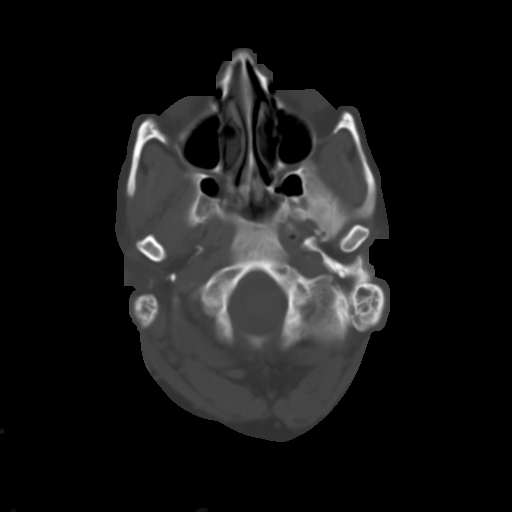
[im 7/31  brain]
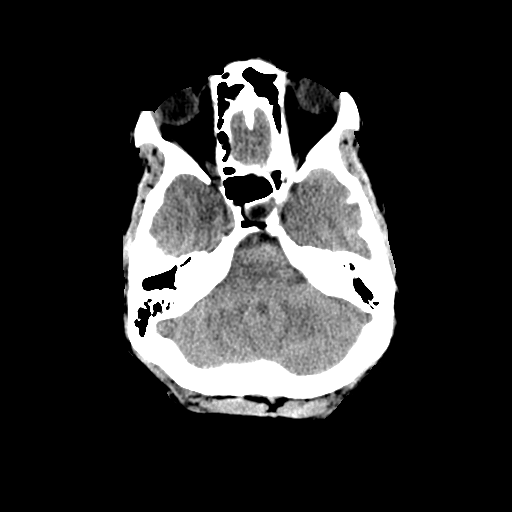
[im 10/31  brain]
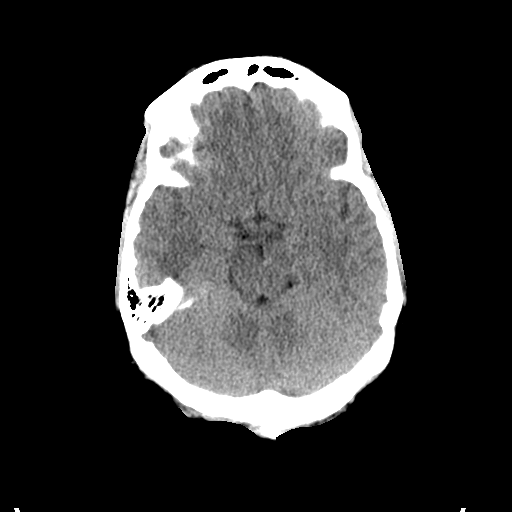
[im 14/31  brain]
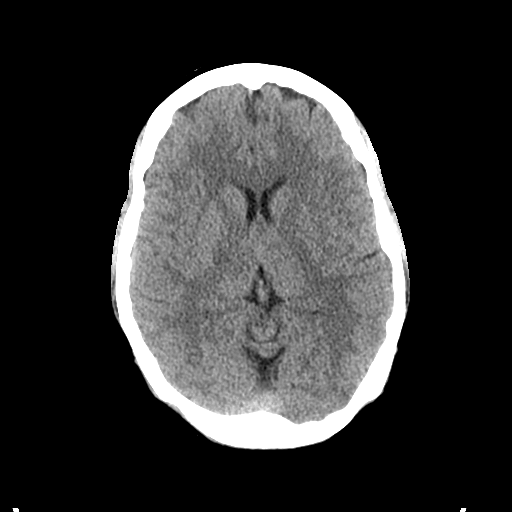
[im 17/31  brain]
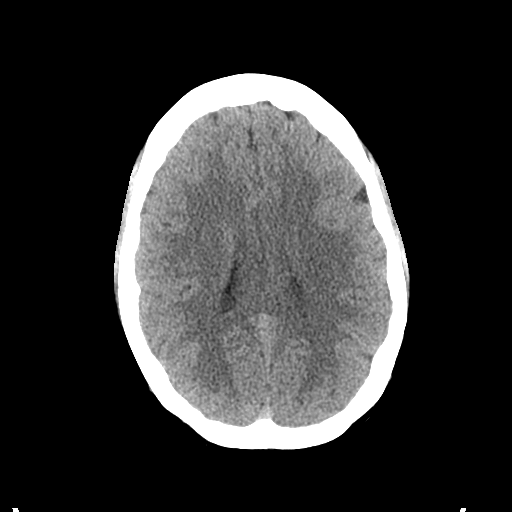
[im 17/31  bone]
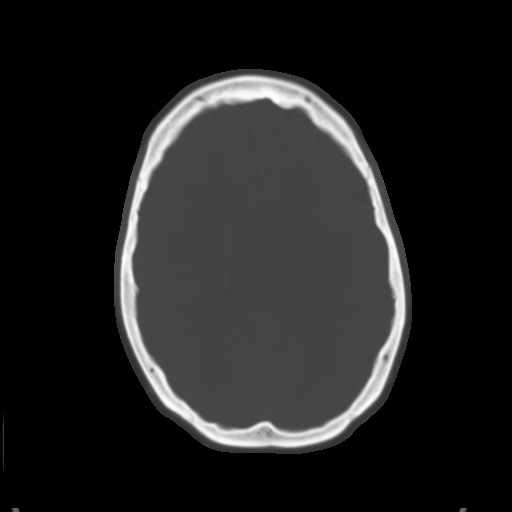
[im 21/31  brain]
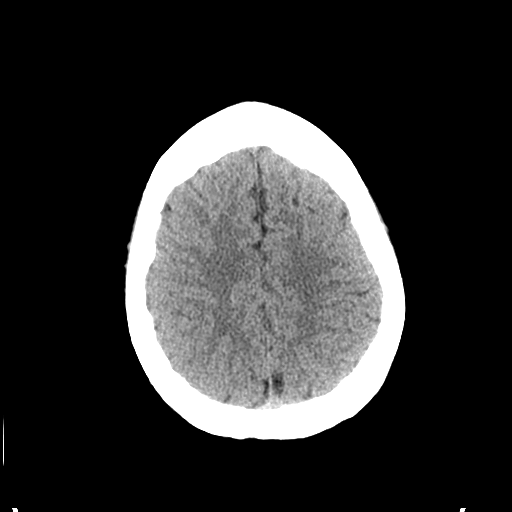
[im 24/31  brain]
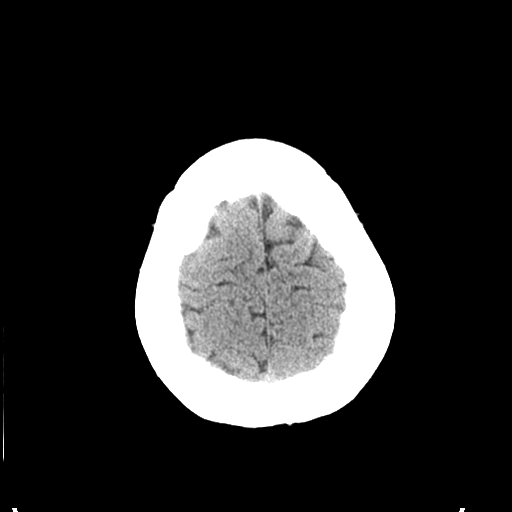
[im 28/31  brain]
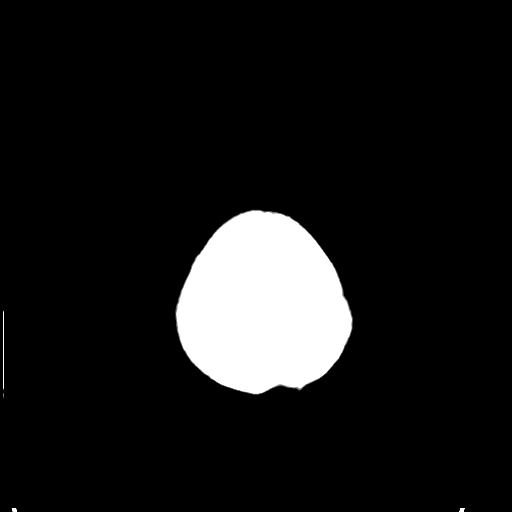

[Series 4: coronal soft tissue · coronal · 0.33mm/px · 3 of 65 slices shown]
[im 22/65  brain]
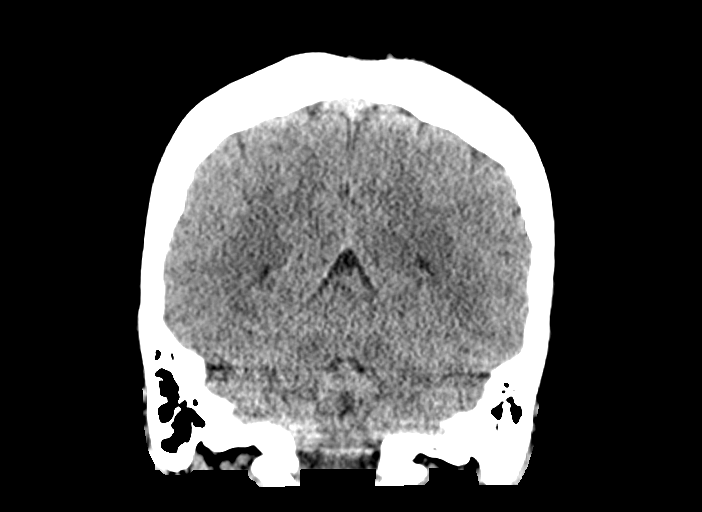
[im 29/65  brain]
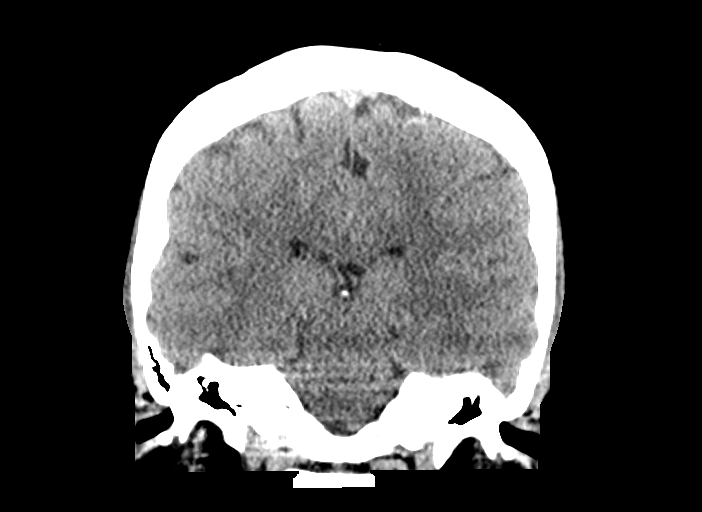
[im 36/65  brain]
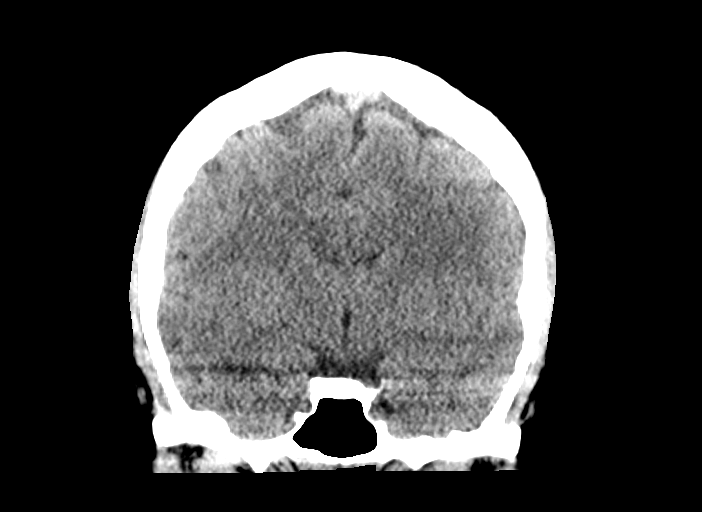

[Series 5: sagittal soft tissue · sagittal · 0.32mm/px · 3 of 51 slices shown]
[im 17/51  brain]
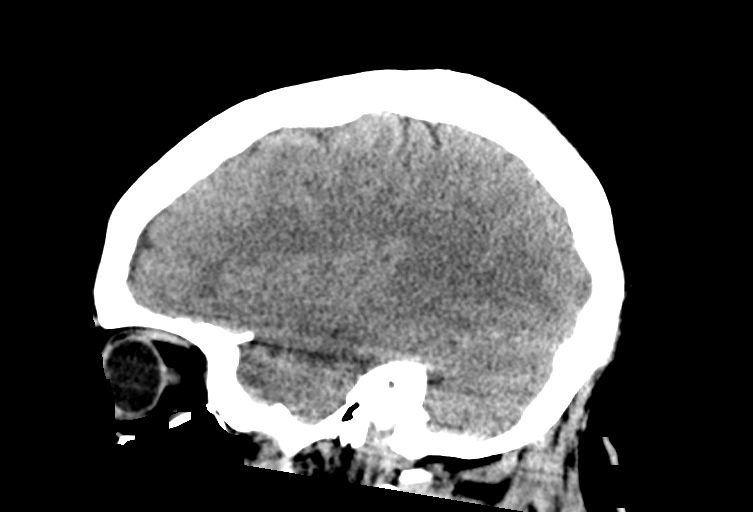
[im 26/51  brain]
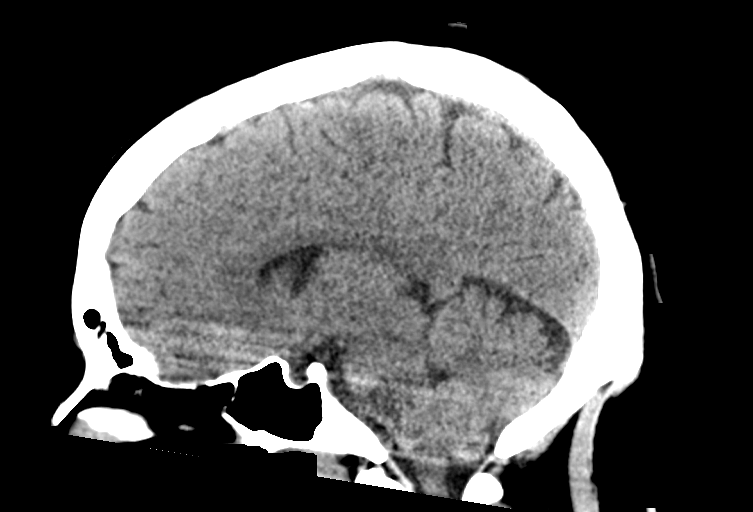
[im 34/51  brain]
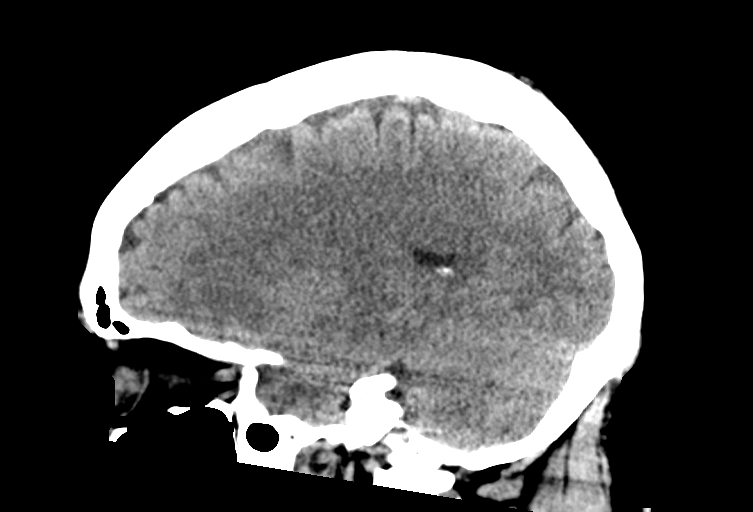

[14 of 47 positions shown; findings below may reference images not displayed]

FINDINGS: Brain: No evidence of acute infarction, hemorrhage, hydrocephalus,
extra-axial collection or mass lesion/mass effect. There is a
hypodense region in the temporal lobe on the right which is likely
artifactual.

Vascular: No hyperdense vessel or unexpected calcification.

Skull: Normal. Negative for fracture or focal lesion.

Sinuses/Orbits: No acute finding.

Other: There is partial opacification of the mastoid air cells on
the left. A few scattered opacities are noted in the mastoid air
cells on the right.
IMPRESSION: 1. No acute intracranial process.
2. Partial opacification of the mastoid air cells on the left and a
few opacities in the mastoid air cells on the right, possible
mastoiditis.

## 2022-07-19 NOTE — Telephone Encounter (Signed)
Attempted to reach the patient to schedule a new patient appt. Patient's voicemail is full, unable to leave message

## 2022-07-20 NOTE — Telephone Encounter (Signed)
Spoke with the patient regarding the referral to GYN oncology. Patient scheduled as new patient with Dr Berline Lopes on 12/5 at 9:45 am. Patient given an arrival time of 9:15 am.  Explained to the patient the the doctor will perform a pelvic exam at this visit. Patient given the policy that no visitors under the 16 yrs are allowed in the [facility]. Patient given the address/phone number for the clinic and that the center offers free valet service.

## 2022-08-03 ENCOUNTER — Encounter: Payer: Self-pay | Admitting: Gynecologic Oncology

## 2022-08-03 ENCOUNTER — Inpatient Hospital Stay: Payer: Medicaid Other | Attending: Gynecologic Oncology | Admitting: Gynecologic Oncology

## 2022-08-03 ENCOUNTER — Other Ambulatory Visit: Payer: Self-pay

## 2022-08-03 VITALS — BP 122/69 | HR 70 | Temp 99.0°F | Resp 20 | Ht 64.0 in | Wt 249.0 lb

## 2022-08-03 DIAGNOSIS — N901 Moderate vulvar dysplasia: Secondary | ICD-10-CM | POA: Diagnosis present

## 2022-08-03 DIAGNOSIS — N871 Moderate cervical dysplasia: Secondary | ICD-10-CM | POA: Insufficient documentation

## 2022-08-03 DIAGNOSIS — Z720 Tobacco use: Secondary | ICD-10-CM

## 2022-08-03 DIAGNOSIS — F17210 Nicotine dependence, cigarettes, uncomplicated: Secondary | ICD-10-CM | POA: Diagnosis not present

## 2022-08-03 DIAGNOSIS — B977 Papillomavirus as the cause of diseases classified elsewhere: Secondary | ICD-10-CM | POA: Diagnosis not present

## 2022-08-03 DIAGNOSIS — Z6841 Body Mass Index (BMI) 40.0 and over, adult: Secondary | ICD-10-CM | POA: Insufficient documentation

## 2022-08-03 DIAGNOSIS — N903 Dysplasia of vulva, unspecified: Secondary | ICD-10-CM

## 2022-08-03 DIAGNOSIS — N879 Dysplasia of cervix uteri, unspecified: Secondary | ICD-10-CM

## 2022-08-03 NOTE — Progress Notes (Unsigned)
GYNECOLOGIC ONCOLOGY NEW PATIENT CONSULTATION   Patient Name: Ana Watson  Patient Age: 31 y.o. Date of Service: 08/04/22 Referring Provider: Bobbye Charleston, MD [place],  [place] [address]   Primary Care Provider: Pcp, No Consulting Provider: Jeral Pinch, MD   Assessment/Plan:  Premenopausal patient with concurrent high-grade cervical and vulvar dysplasia.  I spent some time reviewing with patient recent biopsies from procedure, showing high-grade dysplasia of both her cervix and her vulva.  We discussed the role that HPV plays in the development of the majority of cervical dysplasia and cancer as well as a good portion of vulvar dysplasia and cancer.  Based on her Pap test results as well as biopsies, both areas of dysplasia appear to be HPV related.  The patient endorses a history of abnormal Pap smear approximately 10 years ago requiring colposcopy.  It is likely that she was HPV positive at this time.    We also discussed that tobacco is an HPV cofactor for the development of dysplasia and malignancy.  Patient has able to decrease tobacco use and I congratulated her for this as well as encouraged her to continue working on decreasing and ultimately quitting.  With regard to treatment of her cervical dysplasia, given the increased risk of progression to malignancy in the setting of high-grade dysplasia as well as need to rule out underlying malignancy, I recommend treatment in the form of either a LEEP procedure or a cone.  I discussed the differences in how both of these procedures are performed and that while LEEP procedure is often adequate and affords patient the ability to have the procedure in the clinic, it can confound the margin status because electrocautery is used.  Patient has had prior discussion with her OB/GYN regarding these procedures.  With regard to her vulvar dysplasia, I also recommend proceeding with treatment.  The lesion itself  is small and while it involves some of the clitoral hood, I think it is far enough away from the clitoris itself to be amenable to either excision or laser therapy. Excision of this lesion would mean excising part of the lateral portion of the clitoral hood.  I discussed the difference in these 2 procedures in terms of what is done in the operating room, that I perform biopsies prior to laser to help rule out underlying malignancy (versus excision of the whole lesion which allows [place] to microscopically examine the entire area), and expected recovery time.  The patient voiced being very overwhelmed today.  She endorses some mistrust of the medical system, including management of her ectopic pregnancy earlier this year as well as prior medical care and some records that were lost.  I encouraged her to ask questions today.  I recommended that she take some time to think about what we discussed prior to making decision regarding treatment.  Ultimately, she could have 1 or both areas of dysplasia treated with me in the operating room.  If she elects to have only her vulvar dysplasia treated at this time, she can follow-up with her OB/GYN for an in office LEEP procedure if that is her preference.  Patient was scheduled for a follow-up phone visit in 1 week to answer any questions and to decide re next steps in management.  A copy of this note was sent to the patient's referring provider.   70 minutes of total time was spent for this patient encounter, including preparation, face-to-face counseling with the patient and coordination of care, and  documentation of the encounter.  Jeral Pinch, MD  Division of Gynecologic Oncology  Department of Obstetrics and Gynecology  [facility]  ___________________________________________  Chief Complaint: Chief Complaint  Patient presents with   Cervical intraepithelial neoplasia (CIN)    CIN II/III   Vulvar intraepithelial neoplasia  (VIN)    VIN II/III    History of Present Illness:  Ana Watson is a 31 y.o. y.o. female who is seen in consultation at the request of Bobbye Charleston, MD for an evaluation of high-grade cervical and vulvar dysplasia.  The patient reports overall doing well.  She endorses a history of normal menses, regular.  She denies any intermenstrual bleeding.  Denies any significant discharge.  Has a history of chlamydia, treated in 2007.  She has been with her current partner for the last 2 years.  They have had unprotected sex for approximately 1 year before getting pregnant at the very end of last year.  This ultimately resulted in an ectopic pregnancy treated with methotrexate.  While this was a surprise, the pregnancy was welcomed.  The patient is hoping to get pregnant and has been trying again for the last 1-2 months.  In terms of her dysplasia history, she underwent colposcopy in 2013.  She reports that a biopsy was taken at this time but she was told that results were normal.  She endorses having normal Pap smear since, some at her OB/GYN's office, some at the health department.  She reports having a normal Pap smear in 2022.  Patient has a history of a vulvar lesion.  This was previously biopsied given concern for HSV.  Patient was told this biopsy was negative.  There has been a ridge of tissue there for some time. At her recent visit, this was described as a 1-1.5 cm lesion on the left labia minora/clitoral hood, punch biopsy performed.  06/03/2022: Pap, ASCUS, HPV16 positive. 07/06/2022: Cervical biopsy at 11:00 shows CIN-2/3.  Endocervical curettage shows CIN-2/3, separate fragments of negative endocervix.  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  PAST MEDICAL HISTORY:  Past Medical History:  Diagnosis Date   Anxiety    Obesity, morbid, BMI 40.0-49.9 ([place])      PAST SURGICAL HISTORY:  Past Surgical History:  Procedure Laterality Date   BREAST CYST EXCISION     FINGER  DEBRIDEMENT     WISDOM TOOTH EXTRACTION      OB/GYN HISTORY:  OB History  Gravida Para Term Preterm AB Living  1 0     1    SAB IAB Ectopic Multiple Live Births      1        # Outcome Date GA Lbr Len/2nd Weight Sex Delivery Anes PTL Lv  1 Ectopic             No LMP recorded.  Age at menarche: 59  Age at menopause: n/a Hx of HRT: n/a Hx of STDs: yes - CT treated in 2017, Trich treated in 2019 (per referring office records) Last pap: see HPI History of abnormal pap smears: yes  MEDICATIONS: Outpatient Encounter Medications as of 08/03/2022  Medication Sig   fluticasone (FLONASE) 50 MCG/ACT nasal spray SMARTSIG:2 Puff(s) Both Nares Every Night   No facility-administered encounter medications on file as of 08/03/2022.    ALLERGIES:  Allergies  Allergen Reactions   Percocet [Oxycodone-Acetaminophen] Nausea And Vomiting     FAMILY HISTORY:  Family History  Problem Relation Age of Onset   Lung cancer Maternal  Uncle    Colon cancer Neg Hx    Breast cancer Neg Hx    Ovarian cancer Neg Hx    Endometrial cancer Neg Hx    Prostate cancer Neg Hx    Pancreatic cancer Neg Hx      SOCIAL HISTORY:  Social Connections: Not on file    REVIEW OF SYSTEMS:  + depression, confusion Denies appetite changes, fevers, chills, fatigue, unexplained weight changes. Denies hearing loss, neck lumps or masses, mouth sores, ringing in ears or voice changes. Denies cough or wheezing.  Denies shortness of breath. Denies chest pain or palpitations. Denies leg swelling. Denies abdominal distention, pain, blood in stools, constipation, diarrhea, nausea, vomiting, or early satiety. Denies pain with intercourse, dysuria, frequency, hematuria or incontinence. Denies hot flashes, pelvic pain, vaginal bleeding or vaginal discharge.   Denies joint pain, back pain or muscle pain/cramps. Denies itching, rash, or wounds. Denies dizziness, headaches, numbness or seizures. Denies swollen lymph nodes  or glands, denies easy bruising or bleeding. Denies anxiety or decreased concentration.  Physical Exam:  Vital Signs for this encounter:  Blood pressure 122/69, pulse 70, temperature 99 F (37.2 C), temperature source Oral, resp. rate 20, height '5\' 4"'$  (1.626 m), weight 249 lb (112.9 kg), SpO2 100 %, unknown if currently breastfeeding. Body mass index is 42.74 kg/m. General: Alert, oriented, no acute distress.  HEENT: Normocephalic, atraumatic. Sclera anicteric.  Chest: Clear to auscultation bilaterally. No wheezes, rhonchi, or rales. Cardiovascular: Regular rate and rhythm, no murmurs, rubs, or gallops.  Abdomen: Obese. Normoactive bowel sounds. Soft, nondistended, nontender to palpation. No masses or hepatosplenomegaly appreciated. No palpable fluid wave.  Extremities: Grossly normal range of motion. Warm, well perfused. No edema bilaterally.  Skin: No rashes or lesions.  Lymphatics: No cervical, supraclavicular, or inguinal adenopathy.  GU:  Raised, 1 x 1.5 cm area of leukoplakia to the left of midline along the lateral aspect of the clitoral hood, more than 1 cm from the midline of the hood.  A portion of the lesion is actually on the tissue adjacent to the clitoral hood.  No other vulvar lesions noted.             Bladder/urethra:  No lesions or masses, well supported bladder             Vagina: Well rugated, no lesions or masses noted.             Cervix: Normal appearing, no lesions.             Uterus: Small, mobile, no parametrial involvement or nodularity.             Adnexa: No masses appreciated.  Rectal: Deferred.  LABORATORY AND RADIOLOGIC DATA:  Outside medical records were reviewed to synthesize the above history, along with the history and physical obtained during the visit.   Lab Results  Component Value Date   WBC 6.5 09/18/2021   HGB 11.8 (L) 09/18/2021   HCT 36.8 09/18/2021   PLT 283 09/18/2021   GLUCOSE 159 (H) 09/18/2021   ALT 40 09/18/2021   AST 28  09/18/2021   NA 134 (L) 09/18/2021   K 3.7 09/18/2021   CL 104 09/18/2021   CREATININE 0.82 09/18/2021   BUN 7 09/18/2021   CO2 24 09/18/2021   TSH 2.581 08/05/2021

## 2022-08-03 NOTE — Patient Instructions (Signed)
It was nice to meet you today.  I am sorry about everything that you are going through.  It can be very stressful to have a new diagnosis and even more stressful to have 2.  Today, we talked about high-grade precancer seen on both the biopsies from your cervix (the face of the cervix and the canal of the cervix) as well as high-grade precancer on the biopsy near your clitoris.  Both of these are related to the human papilloma virus or HPV.  Because of the increased risk of progression to cancer when we see precancer on the biopsy, I am recommending treatment of both areas.  For the cervix, I would recommend a larger biopsy, that could be done with either a LEEP procedure in clinic or a cone procedure in the operating room.  I think either of these procedures could adequately treat the precancer seen on your recent biopsies.  For the vulvar precancer, while the area is close to the clitoris, I think there is enough room to either remove the precancer or laser) burn the precancerous cells.   We will have a phone visit next week so that I can answer any questions that come up about treatment for either type of precancer.  At that visit, we can discuss getting you scheduled for 1 or 2 procedures if you are ready to do so.

## 2022-08-04 DIAGNOSIS — B977 Papillomavirus as the cause of diseases classified elsewhere: Secondary | ICD-10-CM | POA: Insufficient documentation

## 2022-08-04 DIAGNOSIS — N879 Dysplasia of cervix uteri, unspecified: Secondary | ICD-10-CM | POA: Insufficient documentation

## 2022-08-04 DIAGNOSIS — N903 Dysplasia of vulva, unspecified: Secondary | ICD-10-CM | POA: Insufficient documentation

## 2022-08-10 ENCOUNTER — Encounter: Payer: Self-pay | Admitting: Gynecologic Oncology

## 2022-08-10 ENCOUNTER — Inpatient Hospital Stay: Payer: Medicaid Other | Admitting: Gynecologic Oncology

## 2022-08-10 ENCOUNTER — Telehealth: Payer: Self-pay | Admitting: Gynecologic Oncology

## 2022-08-10 NOTE — Telephone Encounter (Signed)
Called the patient at our scheduled phone visit time.  No answer.  Left voicemail with callback number.  Also sent her a MyChart message.  Jeral Pinch MD Gynecologic Oncology

## 2022-08-17 ENCOUNTER — Telehealth: Payer: Self-pay | Admitting: Oncology

## 2022-08-17 NOTE — Telephone Encounter (Signed)
Called [place] regarding her missed phone visit with Dr. Berline Lopes.  She said she just started a new job and apologized for missing the call.  She is trying to eat healthier and improve her health in general before deciding on a procedure.  She would like to reschedule the phone visit after the first of the year.  Advised her we will call her back with a new appointment.

## 2022-08-17 NOTE — Telephone Encounter (Signed)
Called [place] back with telephone appointment with Dr. Berline Lopes on 09/01/22.  She verbalized understanding and agreement of appointment date.

## 2022-09-01 ENCOUNTER — Inpatient Hospital Stay: Payer: Medicaid Other | Attending: Gynecologic Oncology | Admitting: Gynecologic Oncology

## 2022-09-01 DIAGNOSIS — Z9889 Other specified postprocedural states: Secondary | ICD-10-CM

## 2022-09-01 DIAGNOSIS — B977 Papillomavirus as the cause of diseases classified elsewhere: Secondary | ICD-10-CM

## 2022-09-01 DIAGNOSIS — Z7189 Other specified counseling: Secondary | ICD-10-CM

## 2022-09-01 DIAGNOSIS — N879 Dysplasia of cervix uteri, unspecified: Secondary | ICD-10-CM | POA: Diagnosis not present

## 2022-09-01 DIAGNOSIS — N903 Dysplasia of vulva, unspecified: Secondary | ICD-10-CM

## 2022-09-02 ENCOUNTER — Encounter: Payer: Self-pay | Admitting: Gynecologic Oncology

## 2022-09-02 NOTE — Progress Notes (Signed)
Gynecologic Oncology Telehealth Note: Gyn-Onc  I connected with Ana Watson on 09/02/22 at  4:30 PM EST by telephone and verified that I am speaking with the correct person using two identifiers.  I discussed the limitations, risks, security and privacy concerns of performing an evaluation and management service by telemedicine and the availability of in-person appointments. I also discussed with the patient that there may be a patient responsible charge related to this service. The patient expressed understanding and agreed to proceed.  Other persons participating in the visit and their role in the encounter: none.  Patient's location: home Provider's location: [facility]  Reason for Visit: follow-up, treatment planning  Treatment History: In terms of her dysplasia history, she underwent colposcopy in 2013.  She reports that a biopsy was taken at this time but she was told that results were normal.  She endorses having normal Pap smear since, some at her OB/GYN's office, some at the health department.  She reports having a normal Pap smear in 2022.   Patient has a history of a vulvar lesion.  This was previously biopsied given concern for HSV.  Patient was told this biopsy was negative.  There has been a ridge of tissue there for some time. At her recent visit, this was described as a 1-1.5 cm lesion on the left labia minora/clitoral hood, punch biopsy performed.   06/03/2022: Pap, ASCUS, HPV16 positive. 07/06/2022: Cervical biopsy at 11:00 shows CIN-2/3.  Endocervical curettage shows CIN-2/3, separate fragments of negative endocervix.  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   Interval History: Doing well.  Denies new symptoms since her visit with me in person.  Past Medical/Surgical History: Past Medical History:  Diagnosis Date   Anxiety    Obesity, morbid, BMI 40.0-49.9 ([place])     Past Surgical History:  Procedure Laterality Date   BREAST CYST EXCISION     FINGER DEBRIDEMENT      WISDOM TOOTH EXTRACTION      Family History  Problem Relation Age of Onset   Lung cancer Maternal Uncle    Colon cancer Neg Hx    Breast cancer Neg Hx    Ovarian cancer Neg Hx    Endometrial cancer Neg Hx    Prostate cancer Neg Hx    Pancreatic cancer Neg Hx     Social History   Socioeconomic History   Marital status: Single    Spouse name: Not on file   Number of children: Not on file   Years of education: Not on file   Highest education level: Not on file  Occupational History   Occupation: truck driver for [employer]  Tobacco Use   Smoking status: Every Day    Packs/day: 0.50    Years: 6.00    Total pack years: 3.00    Types: Cigarettes   Smokeless tobacco: Never  Vaping Use   Vaping Use: Never used  Substance and Sexual Activity   Alcohol use: Not Currently    Comment: occasional   Drug use: Yes    Types: Marijuana   Sexual activity: Yes    Birth control/protection: None  Other Topics Concern   Not on file  Social History Narrative   Not on file   Social Determinants of Health   Financial Resource Strain: Not on file  Food Insecurity: Food Insecurity Present (09/07/2021)   Hunger Vital Sign    Worried About Running Out of Food in the Last Year: Sometimes true    Ran Out of Food in  the Last Year: Sometimes true  Transportation Needs: No Transportation Needs (09/07/2021)   PRAPARE - Hydrologist (Medical): No    Lack of Transportation (Non-Medical): No  Physical Activity: Not on file  Stress: Not on file  Social Connections: Not on file    Current Medications:  Current Outpatient Medications:    fluticasone (FLONASE) 50 MCG/ACT nasal spray, SMARTSIG:2 Puff(s) Both Nares Every Night, Disp: , Rfl:   Review of Symptoms: Pertinent positives as per HPI.  Physical Exam: Deferred given limitations of phone visit.  Laboratory & Radiologic Studies: None new  Assessment & Plan: Ana Watson is a 32 y.o. woman with  concurrent high-grade vulvar and cervical dysplasia.  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  We reviewed options for surgical treatment of both.  She continues to be very anxious about surgical treatment.  She asked about the possibility of treating her vulvar dysplasia with Aldara cream.  There are data showing regression of usual type vulvar dysplasia with the use of imiquimod.  Similarly, there have been studies showing regression of high-grade cervical dysplasia with the use of imiquimod.  I have asked the patient to think about our discussion of surgery over the weekend and call Santiago Glad, our nurse navigator, on Monday or Tuesday.  If she ultimately decides not to proceed with surgical treatment of her dysplasia, we will plan on medical management with imiquimod.  I discussed the assessment and treatment plan with the patient. The patient was provided with an opportunity to ask questions and all were answered. The patient agreed with the plan and demonstrated an understanding of the instructions.   The patient was advised to call back or see an in-person evaluation if the symptoms worsen or if the condition fails to improve as anticipated.   12 minutes of total time was spent for this patient encounter, including preparation, phone counseling with the patient and coordination of care, and documentation of the encounter.   Jeral Pinch, MD  Division of Gynecologic Oncology  Department of Obstetrics and Gynecology  [facility]

## 2022-09-07 ENCOUNTER — Telehealth: Payer: Self-pay | Admitting: Oncology

## 2022-09-07 NOTE — Telephone Encounter (Signed)
Called Ana Watson regarding her decision about surgery.  She said she has decided to wait on the surgery because she is still scared and needs some time to work with her nerves.  She wants to go forward with trying the Aldara cream.

## 2022-09-08 ENCOUNTER — Telehealth: Payer: Self-pay | Admitting: Gynecologic Oncology

## 2022-09-08 NOTE — Telephone Encounter (Signed)
Called patient. Discussed her desire to treat vulvar and cervical dysplasia topically and to avoid surgery. She voices understanding that surgery therapy is standard of care for these two dysplasias.  I spoke with Dr. Merilyn Baba at [facility] - she has provided recommendations for treatment of both sites (5FU for cervical and TCA followed by Aldara for vulvar) of dysplasia. I offered to the patient to send her to [facility] dysplasia clinic in [place]. Patient was amenable. She is aware that I am happy to see her for follow up or for some of her visits during treatment as needed.  Jeral Pinch MD Gynecologic Oncology

## 2022-09-09 ENCOUNTER — Encounter (HOSPITAL_COMMUNITY): Payer: Self-pay | Admitting: Emergency Medicine

## 2022-09-09 ENCOUNTER — Other Ambulatory Visit: Payer: Self-pay

## 2022-09-09 ENCOUNTER — Ambulatory Visit (HOSPITAL_COMMUNITY)
Admission: EM | Admit: 2022-09-09 | Discharge: 2022-09-09 | Disposition: A | Discharge status: Home / Self Care | Payer: Medicaid Other | Attending: Emergency Medicine | Admitting: Emergency Medicine

## 2022-09-09 DIAGNOSIS — Z3201 Encounter for pregnancy test, result positive: Secondary | ICD-10-CM

## 2022-09-09 LAB — POC URINE PREG, ED: Preg Test, Ur: POSITIVE — AB

## 2022-09-09 NOTE — ED Triage Notes (Signed)
LMP 12/11.  Patient has taken 2 pregnancy tests and both are positive.  Patient requesting pregnancy test here for confirmation

## 2022-09-09 NOTE — Discharge Instructions (Signed)
Congratulations!! Please follow up with your ob/gyn. Start prenatal vitamin.

## 2022-09-09 NOTE — ED Provider Notes (Signed)
Ana Watson    CSN: [id number] Arrival date & time: 09/09/22  1600      History   Chief Complaint Chief Complaint  Patient presents with   wants pregnancy test    HPI Ana Watson is a 32 y.o. female.  Presents for confirmation pregnancy test. She had two positive tests at home this morning LMP 12/10  History of ectopic requiring methotrexate. 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 Authorizing Provider  fluticasone (FLONASE) 50 MCG/ACT nasal spray SMARTSIG:2 Puff(s) Both Nares Every Night 05/19/21   [provider]    Family History Family History  Problem Relation Age of Onset   Lung cancer Maternal Uncle    Colon cancer Neg Hx    Breast cancer Neg Hx    Ovarian cancer Neg Hx    Endometrial cancer Neg Hx    Prostate cancer Neg Hx    Pancreatic cancer Neg Hx     Social History Social History   Tobacco Use   Smoking status: Every Day    Packs/day: 0.50    Years: 6.00    Total pack years: 3.00    Types: Cigarettes   Smokeless tobacco: Never  Vaping Use   Vaping Use: Never used  Substance Use Topics   Alcohol use: Not Currently    Comment: occasional    Drug use: Yes    Types: Marijuana     Allergies   Percocet [oxycodone-acetaminophen]   Review of Systems Review of Systems As per HPI  Physical Exam Triage Vital Signs ED Triage Vitals  Enc Vitals Group     BP 09/09/22 1735 118/76     Pulse Rate 09/09/22 1735 75     Resp 09/09/22 1735 18     Temp 09/09/22 1735 98 F (36.7 C)     Temp Source 09/09/22 1735 Oral     SpO2 09/09/22 1735 96 %     Weight --      Height --      Head Circumference --      Peak Flow --      Pain Score 09/09/22 1733 0     Pain Loc --      Pain Edu? --      Excl. in [place]? --    No data found.  Updated Vital Signs BP 118/76 (BP Location: Right Arm)  Pulse 75   Temp 98 F (36.7 C) (Oral)   Resp 18   LMP 08/08/2022   SpO2 96%   Physical Exam Vitals and nursing note reviewed.  Constitutional:      General: She is not in acute distress.    Appearance: Normal appearance.  HENT:     Mouth/Throat:     Pharynx: Oropharynx is clear.  Cardiovascular:     Rate and Rhythm: Normal rate and regular rhythm.  Pulmonary:     Effort: Pulmonary effort is normal.  Neurological:     Mental Status: She is alert and oriented to person, place, and time.     UC Treatments / Results  Labs (all labs ordered are listed, but only abnormal results are displayed) Labs Reviewed  POC URINE PREG, ED - Abnormal; Notable for the following components:      Result Value   Preg Test, Ur POSITIVE (*)    All other components within normal limits    EKG   Radiology No results found.  Procedures Procedures (including critical care time)  Medications Ordered in UC Medications - No data to display  Initial Impression / Assessment and Plan / UC Course  I have reviewed the triage vital signs and the nursing notes.  Pertinent labs & imaging results that were available during my care of the patient were reviewed by me and considered in my medical decision making (see chart for details).  UPT  positive Discussed with patient. I recommend close follow up with her ob/gyn. Patient agrees to plan  Final Clinical Impressions(s) / UC Diagnoses   Final diagnoses:  Pregnancy test positive     Discharge Instructions      Congratulations!! Please follow up with your ob/gyn. Start prenatal vitamin.     ED Prescriptions   None    PDMP not reviewed this encounter.   Les Pou, [place] 09/09/22 1757

## 2022-09-10 ENCOUNTER — Telehealth: Payer: Self-pay | Admitting: Family Medicine

## 2022-09-10 DIAGNOSIS — Z8759 Personal history of other complications of pregnancy, childbirth and the puerperium: Secondary | ICD-10-CM

## 2022-09-10 NOTE — Telephone Encounter (Signed)
Called patient stating I am returning her phone call. Patient reports history of ectopic pregnancy last year and would like her levels checked to make sure everything is okay- denies pain or bleeding. Can collect non stat bhcg x 2 per Dr Kennon Rounds. Discussed with patient and scheduled appts for 1/15 & 1/17. Patient had no other questions.

## 2022-09-10 NOTE — Telephone Encounter (Signed)
Patient called in to set up prenatal care. She said with her last pregnancy she went through a tough process of being told multiple things in regards to needing blood work and a shot. She wants to make sure she doesn't need those things with this pregnancy.

## 2022-09-13 ENCOUNTER — Other Ambulatory Visit: Payer: Medicaid Other

## 2022-09-13 DIAGNOSIS — Z8759 Personal history of other complications of pregnancy, childbirth and the puerperium: Secondary | ICD-10-CM

## 2022-09-14 LAB — BETA HCG QUANT (REF LAB): hCG Quant: 1545 m[IU]/mL

## 2022-09-15 ENCOUNTER — Other Ambulatory Visit: Payer: Self-pay

## 2022-09-15 ENCOUNTER — Other Ambulatory Visit: Payer: Medicaid Other

## 2022-09-15 DIAGNOSIS — Z8759 Personal history of other complications of pregnancy, childbirth and the puerperium: Secondary | ICD-10-CM

## 2022-09-16 ENCOUNTER — Telehealth: Payer: Self-pay

## 2022-09-16 DIAGNOSIS — O3680X0 Pregnancy with inconclusive fetal viability, not applicable or unspecified: Secondary | ICD-10-CM

## 2022-09-16 DIAGNOSIS — Z8759 Personal history of other complications of pregnancy, childbirth and the puerperium: Secondary | ICD-10-CM

## 2022-09-16 LAB — BETA HCG QUANT (REF LAB): hCG Quant: 2648 m[IU]/mL

## 2022-09-16 NOTE — Telephone Encounter (Signed)
Call placed to pt. Spoke with pt. Pt given results per Dr Kennon Rounds. Pt verbalized understanding and agreeable to plan of care. Pt scheduled for beta on Friday at 930am and also scheduled for viability scan on 09/17/2022 at [facility] 1230pm. Pt agreeable to date and time of appts. Pt advised ectopic precautions.  [place] [facility]

## 2022-09-16 NOTE — Telephone Encounter (Signed)
-----  Message from Donnamae Jude, MD sent at 09/16/2022  8:02 AM EST ----- Her HCG did rise by 60%, we can repeat it on Friday and schedule for viability scan asap--ectopic precautions.

## 2022-09-17 ENCOUNTER — Ambulatory Visit (HOSPITAL_COMMUNITY)
Admission: RE | Admit: 2022-09-17 | Discharge: 2022-09-17 | Disposition: A | Discharge status: Home / Self Care | Payer: Medicaid Other | Source: Ambulatory Visit | Attending: Family Medicine | Admitting: Family Medicine

## 2022-09-17 ENCOUNTER — Other Ambulatory Visit: Payer: Medicaid Other

## 2022-09-17 ENCOUNTER — Encounter: Payer: Self-pay | Admitting: Obstetrics and Gynecology

## 2022-09-17 DIAGNOSIS — O3680X0 Pregnancy with inconclusive fetal viability, not applicable or unspecified: Secondary | ICD-10-CM

## 2022-09-17 DIAGNOSIS — Z8759 Personal history of other complications of pregnancy, childbirth and the puerperium: Secondary | ICD-10-CM | POA: Diagnosis present

## 2022-09-18 ENCOUNTER — Telehealth: Payer: Self-pay | Admitting: Family Medicine

## 2022-09-18 LAB — BETA HCG QUANT (REF LAB): hCG Quant: 3582 m[IU]/mL

## 2022-09-18 NOTE — Telephone Encounter (Signed)
Ultrasounds Results Note  SUBJECTIVE HPI:  Ms. Ana Watson is a 32 y.o. G2P0010 who had an ectopic last year, who had serial HCGs done and viability u/s called for followup ultrasound results. The patient denies abdominal pain or vaginal bleeding.  Upon review of the patient's records, patient was first seen in [facility] on 09/13/22  for serial labs.   BHCG on that day was 1545. F/u in 48 hours showed BHCG was 2648, f/u in 48 more hours showed BHCG of 3582.  Repeat ultrasound was performed yesterday.   LAB RESULTS Lab Results  Component Value Date   HCGQUANT 3,582 09/17/2022   HCGQUANT 2,648 09/15/2022   HCGQUANT 1,545 09/13/2022    IMAGING US OB LESS THAN 14 WEEKS WITH OB TRANSVAGINAL  Result Date: 09/17/2022 CLINICAL DATA:  Positive pregnancy test. Viability exam. History of ectopic. EXAM: OBSTETRIC <14 WK [place] AND TRANSVAGINAL OB US TECHNIQUE: Both transabdominal and transvaginal ultrasound examinations were performed for complete evaluation of the gestation as well as the maternal uterus, adnexal regions, and pelvic cul-de-sac. Transvaginal technique was performed to assess early pregnancy. COMPARISON:  Ob ultrasound 09/04/2021 FINDINGS: Intrauterine gestational sac: None Yolk sac:  Not Visualized. Embryo:  Not Visualized. Cardiac Activity: Not Visualized. Maternal uterus/adnexae: Normal right and left ovaries. There is a 2.8 x 2.7 x 2.2 cm fibroid within the uterus. Trace pelvic fluid. IMPRESSION: No intrauterine gestation identified. In the setting of positive pregnancy test and no definite intrauterine pregnancy, this reflects a pregnancy of unknown location. Differential considerations include early normal IUP, abnormal IUP, or nonvisualized ectopic pregnancy. Differentiation is achieved with serial beta HCG supplemented by repeat sonography as clinically warranted. Electronically Signed   By: Lovey Newcomer M.D.   On: 09/17/2022 13:40    ASSESSMENT  Pregnancy of unknown location  PLAN HCG  did not rise by 67%. U/S fails to show IUP. She is without symptoms.  Recommend f/u MAU tomorrow for repeat HCG and repeat u/s. Ectopic precautions Message sent to MAU providers to inform of such.  Donnamae Jude, MD  09/18/2022  12:43 PM

## 2022-09-19 ENCOUNTER — Inpatient Hospital Stay (HOSPITAL_COMMUNITY)
Admission: AD | Admit: 2022-09-19 | Discharge: 2022-09-19 | Disposition: A | Discharge status: Home / Self Care | Payer: Medicaid Other | Attending: Family Medicine | Admitting: Family Medicine

## 2022-09-19 ENCOUNTER — Other Ambulatory Visit (HOSPITAL_COMMUNITY)
Admission: RE | Admit: 2022-09-19 | Discharge: 2022-09-19 | Disposition: A | Discharge status: Home / Self Care | Payer: Medicaid Other | Source: Ambulatory Visit | Attending: Family Medicine | Admitting: Family Medicine

## 2022-09-19 ENCOUNTER — Encounter (HOSPITAL_COMMUNITY): Payer: Self-pay | Admitting: Family Medicine

## 2022-09-19 ENCOUNTER — Inpatient Hospital Stay (HOSPITAL_COMMUNITY): Payer: Medicaid Other

## 2022-09-19 DIAGNOSIS — R1033 Periumbilical pain: Secondary | ICD-10-CM | POA: Diagnosis present

## 2022-09-19 DIAGNOSIS — O209 Hemorrhage in early pregnancy, unspecified: Secondary | ICD-10-CM | POA: Diagnosis present

## 2022-09-19 DIAGNOSIS — D259 Leiomyoma of uterus, unspecified: Secondary | ICD-10-CM | POA: Diagnosis not present

## 2022-09-19 DIAGNOSIS — Z3A01 Less than 8 weeks gestation of pregnancy: Secondary | ICD-10-CM | POA: Diagnosis not present

## 2022-09-19 DIAGNOSIS — Z3A00 Weeks of gestation of pregnancy not specified: Secondary | ICD-10-CM | POA: Diagnosis not present

## 2022-09-19 DIAGNOSIS — O0911 Supervision of pregnancy with history of ectopic or molar pregnancy, first trimester: Secondary | ICD-10-CM | POA: Diagnosis not present

## 2022-09-19 DIAGNOSIS — O00102 Left tubal pregnancy without intrauterine pregnancy: Secondary | ICD-10-CM | POA: Insufficient documentation

## 2022-09-19 DIAGNOSIS — O09291 Supervision of pregnancy with other poor reproductive or obstetric history, first trimester: Secondary | ICD-10-CM | POA: Diagnosis not present

## 2022-09-19 DIAGNOSIS — O26891 Other specified pregnancy related conditions, first trimester: Secondary | ICD-10-CM | POA: Diagnosis present

## 2022-09-19 DIAGNOSIS — O3411 Maternal care for benign tumor of corpus uteri, first trimester: Secondary | ICD-10-CM | POA: Diagnosis not present

## 2022-09-19 LAB — CBC
HCT: 37.2 % (ref 36.0–46.0)
Hemoglobin: 11.9 g/dL — ABNORMAL LOW (ref 12.0–15.0)
MCH: 28.2 pg (ref 26.0–34.0)
MCHC: 32 g/dL (ref 30.0–36.0)
MCV: 88.2 fL (ref 80.0–100.0)
Platelets: 274 10*3/uL (ref 150–400)
RBC: 4.22 MIL/uL (ref 3.87–5.11)
RDW: 12.1 % (ref 11.5–15.5)
WBC: 7.5 10*3/uL (ref 4.0–10.5)
nRBC: 0 % (ref 0.0–0.2)

## 2022-09-19 LAB — COMPREHENSIVE METABOLIC PANEL
ALT: 32 U/L (ref 0–44)
AST: 24 U/L (ref 15–41)
Albumin: 3.5 g/dL (ref 3.5–5.0)
Alkaline Phosphatase: 60 U/L (ref 38–126)
Anion gap: 7 (ref 5–15)
BUN: 9 mg/dL (ref 6–20)
CO2: 21 mmol/L — ABNORMAL LOW (ref 22–32)
Calcium: 8.7 mg/dL — ABNORMAL LOW (ref 8.9–10.3)
Chloride: 104 mmol/L (ref 98–111)
Creatinine, Ser: 0.76 mg/dL (ref 0.44–1.00)
GFR, Estimated: >60 mL/min (ref >60)
Glucose, Bld: 169 mg/dL — ABNORMAL HIGH (ref 70–99)
Potassium: 3.8 mmol/L (ref 3.5–5.1)
Sodium: 132 mmol/L — ABNORMAL LOW (ref 135–145)
Total Bilirubin: 0.5 mg/dL (ref 0.3–1.2)
Total Protein: 6.8 g/dL (ref 6.5–8.1)

## 2022-09-19 LAB — HCG, QUANTITATIVE, PREGNANCY: hCG, Beta Chain, Quant, S: 7077 m[IU]/mL — ABNORMAL HIGH (ref <5)

## 2022-09-19 NOTE — MAU Provider Note (Addendum)
Chief Complaint: Follow-up   None    SUBJECTIVE HPI: Ana Watson is a 32 y.o. G2P0010 at  who presents to Maternity Admissions reporting:  Vaginal Bleeding: spotting Passage of tissue or clots: none Dizziness: none  O POS  Pain Location: midline Quality: cramping Duration: a few days Modifying factors: none Associated signs and symptoms: none  Narrative & Impression  CLINICAL DATA:  Positive pregnancy test. Viability exam. History of ectopic.   EXAM: OBSTETRIC <14 WK [place] AND TRANSVAGINAL OB US   TECHNIQUE: Both transabdominal and transvaginal ultrasound examinations were performed for complete evaluation of the gestation as well as the maternal uterus, adnexal regions, and pelvic cul-de-sac. Transvaginal technique was performed to assess early pregnancy.   COMPARISON:  Ob ultrasound 09/04/2021   FINDINGS: Intrauterine gestational sac: None   Yolk sac:  Not Visualized.   Embryo:  Not Visualized.   Cardiac Activity: Not Visualized.   Maternal uterus/adnexae: Normal right and left ovaries. There is a 2.8 x 2.7 x 2.2 cm fibroid within the uterus. Trace pelvic fluid.   IMPRESSION: No intrauterine gestation identified. In the setting of positive pregnancy test and no definite intrauterine pregnancy, this reflects a pregnancy of unknown location. Differential considerations include early normal IUP, abnormal IUP, or nonvisualized ectopic pregnancy. Differentiation is achieved with serial beta HCG supplemented by repeat sonography as clinically warranted.     Electronically Signed   By: Lovey Newcomer M.D.   On: 09/17/2022 13:40     Latest Reference Range & Units 09/13/22 10:32 09/15/22 10:47 09/17/22 10:25 09/19/22 11:46  hCG Quant mIU/mL 1,545 2,648 3,582   HCG, Beta Chain, Quant, S <5 mIU/mL    7,077 (H)  (H): Data is abnormally high  Past Medical History:  Diagnosis Date   Anxiety    Obesity, morbid, BMI 40.0-49.9 ([place])    OB History  Gravida Para  Term Preterm AB Living  2 0     1    SAB IAB Ectopic Multiple Live Births      1        # Outcome Date GA Lbr Len/2nd Weight Sex Delivery Anes PTL Lv  2 Current           1 Ectopic            Past Surgical History:  Procedure Laterality Date   BREAST CYST EXCISION     FINGER DEBRIDEMENT     WISDOM TOOTH EXTRACTION     Social History   Socioeconomic History   Marital status: Single    Spouse name: Not on file   Number of children: Not on file   Years of education: Not on file   Highest education level: Not on file  Occupational History   Occupation: truck driver for [employer]  Tobacco Use   Smoking status: Every Day    Packs/day: 0.50    Years: 6.00    Total pack years: 3.00    Types: Cigarettes   Smokeless tobacco: Never  Vaping Use   Vaping Use: Never used  Substance and Sexual Activity   Alcohol use: Not Currently    Comment: occasional   Drug use: Yes    Types: Marijuana   Sexual activity: Yes    Birth control/protection: None  Other Topics Concern   Not on file  Social History Narrative   Not on file   Social Determinants of Health   Financial Resource Strain: Not on file  Food Insecurity: Food Insecurity Present (09/07/2021)   Hunger Vital  Sign    Worried About Charity fundraiser in the Last Year: Sometimes true    Ran Out of Food in the Last Year: Sometimes true  Transportation Needs: No Transportation Needs (09/07/2021)   PRAPARE - Hydrologist (Medical): No    Lack of Transportation (Non-Medical): No  Physical Activity: Not on file  Stress: Not on file  Social Connections: Not on file  Intimate Partner Violence: Not on file   No current facility-administered medications on file prior to encounter.   Current Outpatient Medications on File Prior to Encounter  Medication Sig Dispense Refill   fluticasone (FLONASE) 50 MCG/ACT nasal spray SMARTSIG:2 Puff(s) Both Nares Every Night     Allergies  Allergen Reactions    Percocet [Oxycodone-Acetaminophen] Nausea And Vomiting    I have reviewed the past Medical Hx, Surgical Hx, Social Hx, Allergies and Medications.   Review of Systems  OBJECTIVE Patient Vitals for the past 24 hrs:  BP Temp Temp src Pulse Resp SpO2  09/19/22 1259 103/64 -- -- 88 -- 100 %  09/19/22 1256 -- 98.7 F (37.1 C) Oral -- 16 --   Constitutional: Well-developed, well-nourished female in no acute distress.  Cardiovascular: normal rate Respiratory: normal rate and effort.  GI: Abd soft MS: Extremities nontender, no edema, normal ROM Neurologic: Alert and oriented x 4.    LAB RESULTS Results for orders placed or performed during the hospital encounter of 09/19/22 (from the past 24 hour(s))  hCG, quantitative, pregnancy     Status: Abnormal   Collection Time: 09/19/22 11:46 AM  Result Value Ref Range   hCG, Beta Chain, Quant, S 7,077 (H) <5 mIU/mL  CBC     Status: Abnormal   Collection Time: 09/19/22 11:46 AM  Result Value Ref Range   WBC 7.5 4.0 - 10.5 K/uL   RBC 4.22 3.87 - 5.11 MIL/uL   Hemoglobin 11.9 (L) 12.0 - 15.0 g/dL   HCT 37.2 36.0 - 46.0 %   MCV 88.2 80.0 - 100.0 fL   MCH 28.2 26.0 - 34.0 pg   MCHC 32.0 30.0 - 36.0 g/dL   RDW 12.1 11.5 - 15.5 %   Platelets 274 150 - 400 K/uL   nRBC 0.0 0.0 - 0.2 %    IMAGING US OB Transvaginal  Result Date: 09/19/2022 CLINICAL DATA:  In appropriate rise and serial quantitative beta HCG. History of prior ectopic pregnancy. Estimated gestational age per LMP 6 weeks 0 days. EXAM: TRANSVAGINAL OB ULTRASOUND TECHNIQUE: Transvaginal ultrasound was performed for complete evaluation of the gestation as well as the maternal uterus, adnexal regions, and pelvic cul-de-sac. COMPARISON:  09/17/2022 FINDINGS: Intrauterine gestational sac: Not visualized. Yolk sac:  Not visualized. Embryo:  Not visualized. Cardiac Activity: Not visualized. Heart Rate: Not visualized.  Bpm MSD: Not applicable. CRL:   Not applicable. Subchorionic hemorrhage:   None visualized. Maternal uterus/adnexae: Uterus is normal size, shape and position. Uterine fibroid present measuring 4.7 cm over the fundus. There is thickened endometrium homogeneous measuring 24 mm. Right ovary is normal. Left ovary is normal size, shape and position with normal color Doppler. Abutting the left ovary is a gestational sac containing yolk sac but no embryo. This is compatible with a left-sided tubal ectopic pregnancy. No free pelvic fluid. IMPRESSION: No IUP. Evidence of left tubal ectopic pregnancy containing gestational sac and yolk sac. No free pelvic fluid. Critical Value/emergent results were called by telephone at the time of interpretation on 09/19/2022 at 1:01 pm to  provider [facility] , who verbally acknowledged these results. Electronically Signed   By: Marin Olp M.D.   On: 09/19/2022 13:01   US OB LESS THAN 14 WEEKS WITH OB TRANSVAGINAL  Result Date: 09/17/2022 CLINICAL DATA:  Positive pregnancy test. Viability exam. History of ectopic. EXAM: OBSTETRIC <14 WK [place] AND TRANSVAGINAL OB US TECHNIQUE: Both transabdominal and transvaginal ultrasound examinations were performed for complete evaluation of the gestation as well as the maternal uterus, adnexal regions, and pelvic cul-de-sac. Transvaginal technique was performed to assess early pregnancy. COMPARISON:  Ob ultrasound 09/04/2021 FINDINGS: Intrauterine gestational sac: None Yolk sac:  Not Visualized. Embryo:  Not Visualized. Cardiac Activity: Not Visualized. Maternal uterus/adnexae: Normal right and left ovaries. There is a 2.8 x 2.7 x 2.2 cm fibroid within the uterus. Trace pelvic fluid. IMPRESSION: No intrauterine gestation identified. In the setting of positive pregnancy test and no definite intrauterine pregnancy, this reflects a pregnancy of unknown location. Differential considerations include early normal IUP, abnormal IUP, or nonvisualized ectopic pregnancy. Differentiation is achieved with serial beta HCG  supplemented by repeat sonography as clinically warranted. Electronically Signed   By: Lovey Newcomer M.D.   On: 09/17/2022 13:40    MAU COURSE CBC, Quant, ABO/Rh - from prior O pos, ultrasound ordered and results reviewed  Images reviewed, shows thickened stripe, no IUP, left sided presumed ectopic thick walled sac, separate from ovary, with probable yolk sac noted. No free fluid. Prior notes from [facility] and prior pregnancy reviewed.  MDM Pain and bleeding in early pregnancy with pregnancy of unknown anatomic location, but hemodynamically stable.  ASSESSMENT 1. Left tubal pregnancy without intrauterine pregnancy    Patient is quite upset about all of this. Has prior ectopic treated with MTX 1 year ago. Pregnancy appears to be on left again. Discussed at length, why we recommend treatment due to risk of rupture, internal hemorrhage and death. Also, given option for surgery to be sure that this is an ectopic. Concern remains due to u/s findings, and HCG > 7000 without IUP noted.  She is tearful. She absolutely declines surgery. She is in agreement with MTX and will return for this tomorrow.  PLAN Discharge home in stable condition. Strict ectopic precautions reviewed verbally and written.   Follow-up Information     [facility] Follow up in 1 day(s).   Specialty: Obstetrics and Gynecology Why: Methotrexate Contact information: [address] [id number] [place] [phone]               Allergies as of 09/19/2022       Reactions   Percocet [oxycodone-acetaminophen] Nausea And Vomiting        Medication List     TAKE these medications    fluticasone 50 MCG/ACT nasal spray Commonly known as: FLONASE SMARTSIG:2 Puff(s) Both Nares Every Night         Donnamae Jude, MD 09/19/2022  2:03 PM

## 2022-09-19 NOTE — MAU Note (Signed)
Pt reports to mau for follow up hcg and [place]. Denies any pain or bleeding today.

## 2022-09-20 ENCOUNTER — Inpatient Hospital Stay (HOSPITAL_COMMUNITY)
Admission: AD | Admit: 2022-09-20 | Discharge: 2022-09-20 | Disposition: A | Discharge status: Home / Self Care | Payer: Medicaid Other | Attending: Family Medicine | Admitting: Family Medicine

## 2022-09-20 ENCOUNTER — Other Ambulatory Visit: Payer: Self-pay

## 2022-09-20 DIAGNOSIS — O00102 Left tubal pregnancy without intrauterine pregnancy: Secondary | ICD-10-CM | POA: Insufficient documentation

## 2022-09-20 DIAGNOSIS — Z3A01 Less than 8 weeks gestation of pregnancy: Secondary | ICD-10-CM

## 2022-09-20 LAB — COMPREHENSIVE METABOLIC PANEL
ALT: 41 U/L (ref 0–44)
AST: 34 U/L (ref 15–41)
Albumin: 3.8 g/dL (ref 3.5–5.0)
Alkaline Phosphatase: 74 U/L (ref 38–126)
Anion gap: 9 (ref 5–15)
BUN: 6 mg/dL (ref 6–20)
CO2: 22 mmol/L (ref 22–32)
Calcium: 9.2 mg/dL (ref 8.9–10.3)
Chloride: 105 mmol/L (ref 98–111)
Creatinine, Ser: 0.78 mg/dL (ref 0.44–1.00)
GFR, Estimated: >60 mL/min (ref >60)
Glucose, Bld: 94 mg/dL (ref 70–99)
Potassium: 4.2 mmol/L (ref 3.5–5.1)
Sodium: 136 mmol/L (ref 135–145)
Total Bilirubin: 0.8 mg/dL (ref 0.3–1.2)
Total Protein: 7.1 g/dL (ref 6.5–8.1)

## 2022-09-20 LAB — CBC
HCT: 36.4 % (ref 36.0–46.0)
Hemoglobin: 11.9 g/dL — ABNORMAL LOW (ref 12.0–15.0)
MCH: 28.3 pg (ref 26.0–34.0)
MCHC: 32.7 g/dL (ref 30.0–36.0)
MCV: 86.7 fL (ref 80.0–100.0)
Platelets: 295 10*3/uL (ref 150–400)
RBC: 4.2 MIL/uL (ref 3.87–5.11)
RDW: 12.2 % (ref 11.5–15.5)
WBC: 5.9 10*3/uL (ref 4.0–10.5)
nRBC: 0 % (ref 0.0–0.2)

## 2022-09-20 LAB — HCG, QUANTITATIVE, PREGNANCY: hCG, Beta Chain, Quant, S: 8963 m[IU]/mL — ABNORMAL HIGH (ref <5)

## 2022-09-20 MED ORDER — METHOTREXATE FOR ECTOPIC PREGNANCY
50.0000 mg/m2 | Freq: Once | INTRAMUSCULAR | Status: AC
  Administered 2022-09-20: 110 mg via INTRAMUSCULAR
  Filled 2022-09-20: qty 4.4

## 2022-09-20 NOTE — MAU Note (Signed)
Ana Watson is a 32 y.o. at 88w1dhere in MAU reporting: she was instructed by Dr. PKennon Roundsto come in  today for repeat [place] labs.  States needs more verification help determine POC by either having surgery or getting shot. LMP: NA Onset of complaint: yesterday Pain score: 0 Vitals:   09/20/22 1429  BP: 117/60  Pulse: 92  Resp: 18  Temp: 99.3 F (37.4 C)  SpO2: 99%     FHT:NA Lab orders placed from triage:   labs

## 2022-09-20 NOTE — MAU Provider Note (Signed)
History     CSN: [id number]  Arrival date and time: 09/20/22 1342     Chief Complaint  Patient presents with   Ectopic Pregnancy   HPI This is a 32 year old G2P001 at 6 weeks and 1 day by LMP.  She has a history of left-sided ectopic pregnancy requiring 2 doses of methotrexate. She has a known ectopic pregnancy. She was seen yesterday and had a quant of 7000 and a left tubal pregnancy measuring 3 x 1.6 x 2.6 cm. She declined surgery. She did not receive the medication last night, but wanted to return today for MTX. She is not having any increase in pain or bleeding.  OB History     Gravida  2   Para  0   Term      Preterm      AB  1   Living         SAB      IAB      Ectopic  1   Multiple      Live Births              Past Medical History:  Diagnosis Date   Anxiety    Obesity, morbid, BMI 40.0-49.9 ([place])     Past Surgical History:  Procedure Laterality Date   BREAST CYST EXCISION     FINGER DEBRIDEMENT     WISDOM TOOTH EXTRACTION      Family History  Problem Relation Age of Onset   Lung cancer Maternal Uncle    Colon cancer Neg Hx    Breast cancer Neg Hx    Ovarian cancer Neg Hx    Endometrial cancer Neg Hx    Prostate cancer Neg Hx    Pancreatic cancer Neg Hx     Social History   Tobacco Use   Smoking status: Every Day    Packs/day: 0.50    Years: 6.00    Total pack years: 3.00    Types: Cigarettes   Smokeless tobacco: Never  Vaping Use   Vaping Use: Never used  Substance Use Topics   Alcohol use: Not Currently    Comment: occasional   Drug use: Yes    Types: Marijuana    Allergies:  Allergies  Allergen Reactions   Percocet [Oxycodone-Acetaminophen] Nausea And Vomiting    Medications Prior to Admission  Medication Sig Dispense Refill Last Dose   fluticasone (FLONASE) 50 MCG/ACT nasal spray SMARTSIG:2 Puff(s) Both Nares Every Night       Review of Systems Physical Exam   Blood pressure 117/60, pulse 92, temperature  99.3 F (37.4 C), temperature source Oral, resp. rate 18, height '5\' 4"'$  (1.626 m), weight 108.7 kg, last menstrual period 08/08/2022, SpO2 99 %, unknown if currently breastfeeding.  Physical Exam Vitals and nursing note reviewed.  Constitutional:      Appearance: Normal appearance.  Cardiovascular:     Rate and Rhythm: Normal rate and regular rhythm.     Pulses: Normal pulses.  Pulmonary:     Effort: Pulmonary effort is normal.     Breath sounds: Normal breath sounds.  Abdominal:     General: Abdomen is flat. There is no distension.     Palpations: Abdomen is soft.  Skin:    General: Skin is warm and dry.     Capillary Refill: Capillary refill takes less than 2 seconds.  Neurological:     General: No focal deficit present.     Mental Status: She is alert.  Psychiatric:  Mood and Affect: Mood normal.        Behavior: Behavior normal.        Thought Content: Thought content normal.        Judgment: Judgment normal.    Results for orders placed or performed during the hospital encounter of 09/20/22 (from the past 24 hour(s))  Comprehensive metabolic panel     Status: None   Collection Time: 09/20/22  2:26 PM  Result Value Ref Range   Sodium 136 135 - 145 mmol/L   Potassium 4.2 3.5 - 5.1 mmol/L   Chloride 105 98 - 111 mmol/L   CO2 22 22 - 32 mmol/L   Glucose, Bld 94 70 - 99 mg/dL   BUN 6 6 - 20 mg/dL   Creatinine, Ser 0.78 0.44 - 1.00 mg/dL   Calcium 9.2 8.9 - 10.3 mg/dL   Total Protein 7.1 6.5 - 8.1 g/dL   Albumin 3.8 3.5 - 5.0 g/dL   AST 34 15 - 41 U/L   ALT 41 0 - 44 U/L   Alkaline Phosphatase 74 38 - 126 U/L   Total Bilirubin 0.8 0.3 - 1.2 mg/dL   GFR, Estimated >60 >60 mL/min   Anion gap 9 5 - 15  CBC     Status: Abnormal   Collection Time: 09/20/22  2:26 PM  Result Value Ref Range   WBC 5.9 4.0 - 10.5 K/uL   RBC 4.20 3.87 - 5.11 MIL/uL   Hemoglobin 11.9 (L) 12.0 - 15.0 g/dL   HCT 36.4 36.0 - 46.0 %   MCV 86.7 80.0 - 100.0 fL   MCH 28.3 26.0 - 34.0 pg    MCHC 32.7 30.0 - 36.0 g/dL   RDW 12.2 11.5 - 15.5 %   Platelets 295 150 - 400 K/uL   nRBC 0.0 0.0 - 0.2 %  hCG, quantitative, pregnancy     Status: Abnormal   Collection Time: 09/20/22  2:26 PM  Result Value Ref Range   hCG, Beta Chain, Quant, S 8,963 (H) <5 mIU/mL   No results found.   MAU Course  Procedures  MDM The patient regarding failure methotrexate injections based on her hCG level and the size of the ectopic pregnancy.  I again offered her surgery as this would be more definitive treatment. She declined surgery and preferred methotrexate. As she is at increased risk for failure, will recommend 2 dose regimen, with second dose on day 4. Will have patient return here for second dose and blood work.  Warning signs of ruptured ectopic pregnancy given.  Assessment and Plan   1. Left tubal pregnancy without intrauterine pregnancy    Patient received MTX without incident. She will be following up here for Day 4 labs and repeat MTX due to high risk of failure. She is aware of potential side effects of medication and signs/symptoms of ruptured ectopic.   Truett Mainland 09/20/2022, 2:59 PM

## 2022-09-23 ENCOUNTER — Other Ambulatory Visit: Payer: Self-pay

## 2022-09-23 ENCOUNTER — Inpatient Hospital Stay (HOSPITAL_COMMUNITY)
Admission: AD | Admit: 2022-09-23 | Discharge: 2022-09-23 | Disposition: A | Discharge status: Home / Self Care | Payer: Medicaid Other | Attending: Obstetrics & Gynecology | Admitting: Obstetrics & Gynecology

## 2022-09-23 DIAGNOSIS — F17210 Nicotine dependence, cigarettes, uncomplicated: Secondary | ICD-10-CM | POA: Diagnosis not present

## 2022-09-23 DIAGNOSIS — O99331 Smoking (tobacco) complicating pregnancy, first trimester: Secondary | ICD-10-CM | POA: Insufficient documentation

## 2022-09-23 DIAGNOSIS — O00102 Left tubal pregnancy without intrauterine pregnancy: Secondary | ICD-10-CM | POA: Diagnosis present

## 2022-09-23 DIAGNOSIS — Z3A01 Less than 8 weeks gestation of pregnancy: Secondary | ICD-10-CM

## 2022-09-23 LAB — COMPREHENSIVE METABOLIC PANEL
ALT: 29 U/L (ref 0–44)
AST: 23 U/L (ref 15–41)
Albumin: 3.4 g/dL — ABNORMAL LOW (ref 3.5–5.0)
Alkaline Phosphatase: 58 U/L (ref 38–126)
Anion gap: 7 (ref 5–15)
BUN: 5 mg/dL — ABNORMAL LOW (ref 6–20)
CO2: 23 mmol/L (ref 22–32)
Calcium: 8.7 mg/dL — ABNORMAL LOW (ref 8.9–10.3)
Chloride: 105 mmol/L (ref 98–111)
Creatinine, Ser: 0.78 mg/dL (ref 0.44–1.00)
GFR, Estimated: >60 mL/min (ref >60)
Glucose, Bld: 125 mg/dL — ABNORMAL HIGH (ref 70–99)
Potassium: 3.8 mmol/L (ref 3.5–5.1)
Sodium: 135 mmol/L (ref 135–145)
Total Bilirubin: 0.6 mg/dL (ref 0.3–1.2)
Total Protein: 6.6 g/dL (ref 6.5–8.1)

## 2022-09-23 LAB — CBC
HCT: 34.8 % — ABNORMAL LOW (ref 36.0–46.0)
Hemoglobin: 11.5 g/dL — ABNORMAL LOW (ref 12.0–15.0)
MCH: 28.8 pg (ref 26.0–34.0)
MCHC: 33 g/dL (ref 30.0–36.0)
MCV: 87.2 fL (ref 80.0–100.0)
Platelets: 285 10*3/uL (ref 150–400)
RBC: 3.99 MIL/uL (ref 3.87–5.11)
RDW: 11.9 % (ref 11.5–15.5)
WBC: 6.9 10*3/uL (ref 4.0–10.5)
nRBC: 0 % (ref 0.0–0.2)

## 2022-09-23 LAB — HCG, QUANTITATIVE, PREGNANCY: hCG, Beta Chain, Quant, S: 12721 m[IU]/mL — ABNORMAL HIGH (ref <5)

## 2022-09-23 MED ORDER — METHOTREXATE FOR ECTOPIC PREGNANCY
50.0000 mg/m2 | Freq: Once | INTRAMUSCULAR | Status: AC
  Administered 2022-09-23: 110 mg via INTRAMUSCULAR
  Filled 2022-09-23: qty 4.4

## 2022-09-23 NOTE — MAU Provider Note (Addendum)
History     CSN: [id number]  Arrival date and time: 09/23/22 0998   Event Date/Time   First Provider Initiated Contact with Patient 09/23/22 1842      Chief Complaint  Patient presents with   Follow-up   Ana Watson is a 32 y.o. G2P0010 at 61w4dwho presents today for day #4 labs after MTX. Patient denies any vaginal bleeding or abdominal pain today. Patient states that she did see Dr. HPhilis Piquein November 2023 however, she [facility] PKennon Rounds   Pelvic Pain The patient's pertinent negatives include no pelvic pain or vaginal bleeding. This is a new problem. The current episode started in the past 7 days. The patient is experiencing no pain. She is pregnant. The vaginal discharge was normal. There has been no bleeding. Menstrual history: LMP 08/08/2022.    OB History     Gravida  2   Para  0   Term      Preterm      AB  1   Living         SAB      IAB      Ectopic  1   Multiple      Live Births              Past Medical History:  Diagnosis Date   Anxiety    Obesity, morbid, BMI 40.0-49.9 [place]     Past Surgical History:  Procedure Laterality Date   BREAST CYST EXCISION     FINGER DEBRIDEMENT     WISDOM TOOTH EXTRACTION      Family History  Problem Relation Age of Onset   Lung cancer Maternal Uncle    Colon cancer Neg Hx    Breast cancer Neg Hx    Ovarian cancer Neg Hx    Endometrial cancer Neg Hx    Prostate cancer Neg Hx    Pancreatic cancer Neg Hx     Social History   Tobacco Use   Smoking status: Every Day    Packs/day: 0.50    Years: 6.00    Total pack years: 3.00    Types: Cigarettes   Smokeless tobacco: Never  Vaping Use   Vaping Use: Never used  Substance Use Topics   Alcohol use: Not Currently    Comment: occasional   Drug use: Yes    Types: Marijuana    Allergies:  Allergies  Allergen Reactions   Percocet [Oxycodone-Acetaminophen] Nausea And Vomiting    Medications  Prior to Admission  Medication Sig Dispense Refill Last Dose   fluticasone (FLONASE) 50 MCG/ACT nasal spray SMARTSIG:2 Puff(s) Both Nares Every Night       Review of Systems  Genitourinary:  Negative for pelvic pain.  All other systems reviewed and are negative.  Physical Exam   Blood pressure (!) 121/59, pulse 76, temperature 99.2 F (37.3 C), temperature source Oral, resp. rate 16, height '5\' 4"'$  (1.626 m), weight 110.2 kg, last menstrual period 08/08/2022, SpO2 100 %, unknown if currently breastfeeding.  Physical Exam Constitutional:      Appearance: She is well-developed.  HENT:     Head: Normocephalic.  Eyes:     Pupils: Pupils are equal, round, and reactive to light.  Cardiovascular:     Rate and Rhythm: Normal rate.  Pulmonary:     Effort: Pulmonary effort is normal. No respiratory distress.  Abdominal:     Palpations: Abdomen is soft.     Tenderness:  There is no abdominal tenderness.  Genitourinary:    Vagina: No bleeding. Vaginal discharge: mucusy.    Comments: External: no lesion Vagina: small amount of white discharge     Musculoskeletal:        General: Normal range of motion.     Cervical back: Normal range of motion and neck supple.  Skin:    General: Skin is warm and dry.  Neurological:     Mental Status: She is alert and oriented to person, place, and time.  Psychiatric:        Mood and Affect: Mood normal.        Behavior: Behavior normal.     Latest Reference Range & Units 09/19/22 11:46 09/20/22 14:26 09/23/22 16:20  HCG, Beta Chain, Quant, S <5 mIU/mL 7,077 (H) 8,963 (H) 12,721 (H)   Results for orders placed or performed during the hospital encounter of 09/23/22 (from the past 24 hour(s))  hCG, quantitative, pregnancy     Status: Abnormal   Collection Time: 09/23/22  4:20 PM  Result Value Ref Range   hCG, Beta Chain, Quant, S 12,721 (H) <5 mIU/mL  CBC     Status: Abnormal   Collection Time: 09/23/22  4:20 PM  Result Value Ref Range   WBC  6.9 4.0 - 10.5 K/uL   RBC 3.99 3.87 - 5.11 MIL/uL   Hemoglobin 11.5 (L) 12.0 - 15.0 g/dL   HCT 34.8 (L) 36.0 - 46.0 %   MCV 87.2 80.0 - 100.0 fL   MCH 28.8 26.0 - 34.0 pg   MCHC 33.0 30.0 - 36.0 g/dL   RDW 11.9 11.5 - 15.5 %   Platelets 285 150 - 400 K/uL   nRBC 0.0 0.0 - 0.2 %  Comprehensive metabolic panel     Status: Abnormal   Collection Time: 09/23/22  4:20 PM  Result Value Ref Range   Sodium 135 135 - 145 mmol/L   Potassium 3.8 3.5 - 5.1 mmol/L   Chloride 105 98 - 111 mmol/L   CO2 23 22 - 32 mmol/L   Glucose, Bld 125 (H) 70 - 99 mg/dL   BUN 5 (L) 6 - 20 mg/dL   Creatinine, Ser 0.78 0.44 - 1.00 mg/dL   Calcium 8.7 (L) 8.9 - 10.3 mg/dL   Total Protein 6.6 6.5 - 8.1 g/dL   Albumin 3.4 (L) 3.5 - 5.0 g/dL   AST 23 15 - 41 U/L   ALT 29 0 - 44 U/L   Alkaline Phosphatase 58 38 - 126 U/L   Total Bilirubin 0.6 0.3 - 1.2 mg/dL   GFR, Estimated >60 >60 mL/min   Anion gap 7 5 - 15     MAU Course  Procedures  MDM  630pm: DW Dr. Nelda Marseille, patient should have surgery at this time, but if she refuses we can offer 2nd dose of MTX.   Spoke with patient that surgery is recommended today. She does not want surgery or to lose the tube. Reviewed with patient that with HCG levels this high it is not likely to be effective. Also reviewed that if it does fail she could experience rupture of her tube which would lead to internal bleeding and possibly death. Patient still does not feel comfortable with surgery and the possibility of losing her tube. Reiterated that if the tube ruptures that she would lose the tube. She states the last time she had an ectopic pregnancy she had to get 2 doses as well, however she states that the doses  were several weeks apart after HCG levels did not decrease over time. Reviewed with patient that if HCG levels are still rising on next check that surgery will likely to be the only option at that time.   6:55pm: DW Dr. Nelda Marseille, ok to proceed with 2nd dose of MTX today and  patient to return Sunday for new day #4 labs.   Assessment and Plan   1. Left tubal pregnancy without intrauterine pregnancy    DC home in stable condition  Bleeding precautions Ectopic precautions RX: none  Return to MAU as needed if symptoms worsen or change  Return to MAU on Sunday 1/28 for repeat labs    Follow-up Information     [facility] Follow up on 09/26/2022.   Specialty: Obstetrics and Gynecology Why: Return Sunday September 26, 2022 for repeat lab work Contact information: [address] [id number] Harrod Pringle Potter, [place]  09/23/22  7:34 PM

## 2022-09-23 NOTE — MAU Note (Signed)
Ana Watson is a 32 y.o. at 67w4dhere in MAU reporting: here for follow up labs and 2nd dose of MTX. Has left sided ectopic. Having some mild cramping, was spotting but that stopped.  Onset of complaint: ongoing  Pain score: 0/10  Vitals:   09/23/22 1629  BP: (!) 121/59  Pulse: 76  Resp: 16  Temp: 99.2 F (37.3 C)  SpO2: 100%     FHT:NA  Lab orders placed from triage: entered by provider

## 2022-09-26 ENCOUNTER — Inpatient Hospital Stay (HOSPITAL_COMMUNITY)
Admission: AD | Admit: 2022-09-26 | Discharge: 2022-09-26 | Disposition: A | Discharge status: Home / Self Care | Payer: Medicaid Other | Attending: Obstetrics & Gynecology | Admitting: Obstetrics & Gynecology

## 2022-09-26 ENCOUNTER — Other Ambulatory Visit: Payer: Self-pay

## 2022-09-26 DIAGNOSIS — F17210 Nicotine dependence, cigarettes, uncomplicated: Secondary | ICD-10-CM | POA: Diagnosis not present

## 2022-09-26 DIAGNOSIS — O009 Unspecified ectopic pregnancy without intrauterine pregnancy: Secondary | ICD-10-CM | POA: Insufficient documentation

## 2022-09-26 DIAGNOSIS — O99330 Smoking (tobacco) complicating pregnancy, unspecified trimester: Secondary | ICD-10-CM | POA: Insufficient documentation

## 2022-09-26 DIAGNOSIS — Z3A01 Less than 8 weeks gestation of pregnancy: Secondary | ICD-10-CM

## 2022-09-26 DIAGNOSIS — Z5181 Encounter for therapeutic drug level monitoring: Secondary | ICD-10-CM

## 2022-09-26 LAB — CBC
HCT: 35.6 % — ABNORMAL LOW (ref 36.0–46.0)
Hemoglobin: 11.7 g/dL — ABNORMAL LOW (ref 12.0–15.0)
MCH: 28.5 pg (ref 26.0–34.0)
MCHC: 32.9 g/dL (ref 30.0–36.0)
MCV: 86.6 fL (ref 80.0–100.0)
Platelets: 282 10*3/uL (ref 150–400)
RBC: 4.11 MIL/uL (ref 3.87–5.11)
RDW: 11.9 % (ref 11.5–15.5)
WBC: 6.3 10*3/uL (ref 4.0–10.5)
nRBC: 0 % (ref 0.0–0.2)

## 2022-09-26 LAB — TYPE AND SCREEN
ABO/RH(D): O POS
Antibody Screen: NEGATIVE

## 2022-09-26 LAB — HCG, QUANTITATIVE, PREGNANCY: hCG, Beta Chain, Quant, S: 10638 m[IU]/mL — ABNORMAL HIGH (ref <5)

## 2022-09-26 NOTE — MAU Provider Note (Signed)
History     CSN: [id number]  Arrival date and time: 09/26/22 1608   None     Chief Complaint  Patient presents with   HCG level   Ana Watson is a 32 y.o. G2P0010 with known ectopic who here today for FU HCG. She was given MTX on 1/22 and then received a second dose of MTX on 09/23/2022. She denies any pain or bleeding today.     OB History     Gravida  2   Para  0   Term      Preterm      AB  1   Living         SAB      IAB      Ectopic  1   Multiple      Live Births              Past Medical History:  Diagnosis Date   Anxiety    Obesity, morbid, BMI 40.0-49.9 ([place])     Past Surgical History:  Procedure Laterality Date   BREAST CYST EXCISION     FINGER DEBRIDEMENT     WISDOM TOOTH EXTRACTION      Family History  Problem Relation Age of Onset   Lung cancer Maternal Uncle    Colon cancer Neg Hx    Breast cancer Neg Hx    Ovarian cancer Neg Hx    Endometrial cancer Neg Hx    Prostate cancer Neg Hx    Pancreatic cancer Neg Hx     Social History   Tobacco Use   Smoking status: Every Day    Packs/day: 0.50    Years: 6.00    Total pack years: 3.00    Types: Cigarettes   Smokeless tobacco: Never  Vaping Use   Vaping Use: Never used  Substance Use Topics   Alcohol use: Not Currently    Comment: occasional   Drug use: Yes    Types: Marijuana    Allergies:  Allergies  Allergen Reactions   Percocet [Oxycodone-Acetaminophen] Nausea And Vomiting    Medications Prior to Admission  Medication Sig Dispense Refill Last Dose   fluticasone (FLONASE) 50 MCG/ACT nasal spray SMARTSIG:2 Puff(s) Both Nares Every Night       Review of Systems  All other systems reviewed and are negative.  Physical Exam   Blood pressure 128/87, pulse 90, temperature 98.7 F (37.1 C), temperature source Oral, resp. rate 20, height 5' 4"$  (1.626 m), weight 109 kg, last menstrual period 08/08/2022, SpO2 100 %, unknown if currently  breastfeeding.  Physical Exam Constitutional:      Appearance: She is well-developed.  HENT:     Head: Normocephalic.  Eyes:     Pupils: Pupils are equal, round, and reactive to light.  Cardiovascular:     Rate and Rhythm: Normal rate.  Pulmonary:     Effort: Pulmonary effort is normal. No respiratory distress.  Abdominal:     Palpations: Abdomen is soft.     Tenderness: There is no abdominal tenderness.  Genitourinary:    Vagina: No bleeding. Vaginal discharge: mucusy.    Comments: External: no lesion Vagina: small amount of white discharge     Musculoskeletal:        General: Normal range of motion.     Cervical back: Normal range of motion.  Skin:    General: Skin is warm and dry.  Neurological:     Mental Status: She is alert and oriented to person,  place, and time.  Psychiatric:        Mood and Affect: Mood normal.        Behavior: Behavior normal.       Latest Reference Range & Units 09/20/22 14:26 09/23/22 16:20 09/26/22 16:22  HCG, Beta Chain, Quant, S <5 mIU/mL 8,963 (H) 12,721 (H) 10,638 (H)  (H): Data is abnormally high  MAU Course  Procedures  MDM 6:05PM: DW Dr. Elonda Husky, HCG has gone down from 1/25 when she was given a second dose of MTX. Plan to have patient return to clinic on 09/30/2022 for HCG and then if continuing to decrease she will be able to proceed with weekly HCGs checks until down to 0.   Assessment and Plan   1. Ectopic pregnancy without intrauterine pregnancy, unspecified location   2. Encounter for methotrexate monitoring    DC home in stable condition  Bleeding precautions Ectopic precautions RX: none  Return to MAU as needed FU for repeat HCG on 09/30/2022    Follow-up [facility] for [facility] Follow up on 09/30/2022.   Specialty: Obstetrics and Gynecology Why: for repeat blood work Contact information: [employer] [phone] [place], [place]  09/26/22  6:13 PM

## 2022-09-26 NOTE — MAU Note (Signed)
Ana Watson is a 32 y.o. at 91w0dhere in MAU reporting: here for repeat HCG level after 2nd dose of Methotrexate.  Denies pain and VB. LMP: NA Onset of complaint: 1 week Pain score: 0 Vitals:   09/26/22 1618  BP: 128/87  Pulse: 90  Resp: 20  Temp: 98.7 F (37.1 C)  SpO2: 100%     FHT:NA Lab orders placed from triage:   T&S, HCG, and CBC

## 2022-09-30 ENCOUNTER — Other Ambulatory Visit: Payer: Self-pay

## 2022-09-30 ENCOUNTER — Ambulatory Visit (INDEPENDENT_AMBULATORY_CARE_PROVIDER_SITE_OTHER): Payer: Medicaid Other | Admitting: General Practice

## 2022-09-30 VITALS — BP 110/60 | HR 82 | Ht 64.0 in | Wt 244.0 lb

## 2022-09-30 DIAGNOSIS — Z3A00 Weeks of gestation of pregnancy not specified: Secondary | ICD-10-CM

## 2022-09-30 DIAGNOSIS — O00102 Left tubal pregnancy without intrauterine pregnancy: Secondary | ICD-10-CM

## 2022-09-30 LAB — BETA HCG QUANT (REF LAB): hCG Quant: 4107 m[IU]/mL

## 2022-09-30 NOTE — Progress Notes (Signed)
Beta HCG Follow-up Visit  Ana Watson presents to [facility] for follow-up beta HCG lab. She was seen in MAU for  vaginal spotting  on 1/21. She was diagnosed with ectopic pregnancy & given a dose of MTX. Patient had second dose of MTX on 1/25 due to rising bhcg. Patient denies  pain or bleeding  today. Discussed with patient that we are following beta HCG levels today. Results will be back in approximately 2 hours. Valid contact number for patient confirmed. I will call the patient with results.   Beta HCG results:          1/21        7,077           1/22        8,963           1/25       12,721            1/28       10,638            2/1         4,107    Results and patient history reviewed with Dr Kennon Rounds, who states reassuring decrease in bhcg levels- patient should have follow up bhcg in 1 week. Patient called and informed of plan for follow-up. Scheduled lab appt for 2/7. Patient would like follow up visit to discuss future pregnancies- front office to call with an appt.  Derinda Late 09/30/2022 9:17 AM

## 2022-10-06 ENCOUNTER — Other Ambulatory Visit: Payer: Self-pay

## 2022-10-06 ENCOUNTER — Other Ambulatory Visit: Payer: Medicaid Other

## 2022-10-06 DIAGNOSIS — O00102 Left tubal pregnancy without intrauterine pregnancy: Secondary | ICD-10-CM

## 2022-10-07 LAB — BETA HCG QUANT (REF LAB): hCG Quant: 2040 m[IU]/mL

## 2022-10-12 ENCOUNTER — Telehealth: Payer: Self-pay | Admitting: Family Medicine

## 2022-10-13 ENCOUNTER — Encounter: Payer: Self-pay | Admitting: Obstetrics and Gynecology

## 2022-10-13 ENCOUNTER — Other Ambulatory Visit: Payer: Self-pay

## 2022-10-13 ENCOUNTER — Telehealth: Payer: Medicaid Other

## 2022-10-13 ENCOUNTER — Ambulatory Visit (INDEPENDENT_AMBULATORY_CARE_PROVIDER_SITE_OTHER): Payer: Medicaid Other | Admitting: Obstetrics and Gynecology

## 2022-10-13 VITALS — BP 99/68 | HR 88

## 2022-10-13 DIAGNOSIS — O009 Unspecified ectopic pregnancy without intrauterine pregnancy: Secondary | ICD-10-CM | POA: Diagnosis not present

## 2022-10-13 DIAGNOSIS — Z3A01 Less than 8 weeks gestation of pregnancy: Secondary | ICD-10-CM

## 2022-10-13 NOTE — Progress Notes (Unsigned)
GYNECOLOGY VISIT  Patient name: Ana Watson MRN [id number]  Date of birth: 09-Oct-1990 Chief Complaint:   Follow-up   History:  Ana Watson is a 32 y.o. G2P0010 being seen today for ectopic pregnancy followup.  Started bleeding after last blood test last week. The last few days it has been passing blood clots, cramping pain as well, similar to a period cramps. No fever or chills. No abnormal vaginal discharge. Not feeling dizzy or lightheaded.   Has regular monthly cycles. No pain with cycles. Current smoker. Aware of weight. At 32 years old had chlamydia   Past Medical History:  Diagnosis Date   Anxiety    Obesity, morbid, BMI 40.0-49.9 ([place])     Past Surgical History:  Procedure Laterality Date   BREAST CYST EXCISION     FINGER DEBRIDEMENT     WISDOM TOOTH EXTRACTION      The following portions of the patient's history were reviewed and updated as appropriate: allergies, current medications, past family history, past medical history, past social history, past surgical history and problem list.   Health Maintenance:   Last pap No results found for: "DIAGPAP", "[place]", "ADEQPAP" Last mammogram: n/a   Review of Systems:  Pertinent items are noted in HPI. Comprehensive review of systems was otherwise negative.   Objective:  Physical Exam BP 99/68   Pulse 88   LMP 08/08/2022    Physical Exam Vitals and nursing note reviewed.  Constitutional:      Appearance: Normal appearance.  HENT:     Head: Normocephalic and atraumatic.  Pulmonary:     Effort: Pulmonary effort is normal.  Abdominal:     Palpations: Abdomen is soft.     Tenderness: There is no abdominal tenderness. There is no guarding or rebound.  Skin:    General: Skin is warm and dry.  Neurological:     General: No focal deficit present.     Mental Status: She is alert.  Psychiatric:        Mood and Affect: Mood normal.        Behavior: Behavior normal.        Thought Content: Thought  content normal.        Judgment: Judgment normal.     Labs and Imaging US OB Transvaginal  Result Date: 09/19/2022 CLINICAL DATA:  In appropriate rise and serial quantitative beta HCG. History of prior ectopic pregnancy. Estimated gestational age per LMP 6 weeks 0 days. EXAM: TRANSVAGINAL OB ULTRASOUND TECHNIQUE: Transvaginal ultrasound was performed for complete evaluation of the gestation as well as the maternal uterus, adnexal regions, and pelvic cul-de-sac. COMPARISON:  09/17/2022 FINDINGS: Intrauterine gestational sac: Not visualized. Yolk sac:  Not visualized. Embryo:  Not visualized. Cardiac Activity: Not visualized. Heart Rate: Not visualized.  Bpm MSD: Not applicable. CRL:   Not applicable. Subchorionic hemorrhage:  None visualized. Maternal uterus/adnexae: Uterus is normal size, shape and position. Uterine fibroid present measuring 4.7 cm over the fundus. There is thickened endometrium homogeneous measuring 24 mm. Right ovary is normal. Left ovary is normal size, shape and position with normal color Doppler. Abutting the left ovary is a gestational sac containing yolk sac but no embryo. This is compatible with a left-sided tubal ectopic pregnancy. No free pelvic fluid. IMPRESSION: No IUP. Evidence of left tubal ectopic pregnancy containing gestational sac and yolk sac. No free pelvic fluid. Critical Value/emergent results were called by telephone at the time of interpretation on 09/19/2022 at 1:01 pm to provider [facility] , who  verbally acknowledged these results. Electronically Signed   By: Marin Olp M.D.   On: 09/19/2022 13:01   US OB LESS THAN 14 WEEKS WITH OB TRANSVAGINAL  Result Date: 09/17/2022 CLINICAL DATA:  Positive pregnancy test. Viability exam. History of ectopic. EXAM: OBSTETRIC <14 WK [place] AND TRANSVAGINAL OB US TECHNIQUE: Both transabdominal and transvaginal ultrasound examinations were performed for complete evaluation of the gestation as well as the maternal uterus,  adnexal regions, and pelvic cul-de-sac. Transvaginal technique was performed to assess early pregnancy. COMPARISON:  Ob ultrasound 09/04/2021 FINDINGS: Intrauterine gestational sac: None Yolk sac:  Not Visualized. Embryo:  Not Visualized. Cardiac Activity: Not Visualized. Maternal uterus/adnexae: Normal right and left ovaries. There is a 2.8 x 2.7 x 2.2 cm fibroid within the uterus. Trace pelvic fluid. IMPRESSION: No intrauterine gestation identified. In the setting of positive pregnancy test and no definite intrauterine pregnancy, this reflects a pregnancy of unknown location. Differential considerations include early normal IUP, abnormal IUP, or nonvisualized ectopic pregnancy. Differentiation is achieved with serial beta HCG supplemented by repeat sonography as clinically warranted. Electronically Signed   By: Lovey Newcomer M.D.   On: 09/17/2022 13:40       Assessment & Plan:   1. Ectopic pregnancy without intrauterine pregnancy, unspecified location Resolving ectopic pregnancy. Reviewed that  2 pregnancies have both been left sided ectopic pregnancy and likely has tubal issues contributing to pregnancy outcomes. Encouraged to stop smoking as well as tubal evaluation following completion of ectopic. Would recommend pelvic US and possible HSG to eval for possible hydrosalpinx or other contributors to recurrent ectopic pregnancy. Also reviewed precaution against intercourse while ectopic still present. Reviewed there is still possibility of rupture and loss of tube until ectopic pregnancy has completely resolved.  - Beta hCG quant (ref lab)    Routine preventative health maintenance measures emphasized.  Darliss Cheney, MD [facility] [place]

## 2022-10-14 NOTE — Telephone Encounter (Signed)
Returned patients call. She would like to know her Hcg levels. Reviewed with lab and was sent non stat and has not been 24 hours. Informed patient it may return today and may come back tomorrow. Reviewed she will be called with results or My Chart message sent with recommendations for follow up. Patient voiced understanding.

## 2022-10-15 ENCOUNTER — Telehealth: Payer: Self-pay

## 2022-10-15 DIAGNOSIS — O009 Unspecified ectopic pregnancy without intrauterine pregnancy: Secondary | ICD-10-CM

## 2022-10-15 LAB — BETA HCG QUANT (REF LAB): hCG Quant: 1748 m[IU]/mL

## 2022-10-15 NOTE — Telephone Encounter (Addendum)
-----   Message from Darliss Cheney, MD sent at 10/15/2022  8:28 AM EST ----- Needs continued weekly bchg to 0 - ectopic   Pt notified of results and provider recommendation.  Pt agreed to coming in on 10/21/22 for non stat beta.    Ana Watson  10/15/22

## 2022-10-21 ENCOUNTER — Other Ambulatory Visit: Payer: Medicaid Other

## 2022-10-21 DIAGNOSIS — O009 Unspecified ectopic pregnancy without intrauterine pregnancy: Secondary | ICD-10-CM

## 2022-10-22 ENCOUNTER — Other Ambulatory Visit: Payer: Self-pay | Admitting: Obstetrics and Gynecology

## 2022-10-22 DIAGNOSIS — O009 Unspecified ectopic pregnancy without intrauterine pregnancy: Secondary | ICD-10-CM

## 2022-10-22 LAB — BETA HCG QUANT (REF LAB): hCG Quant: 1159 m[IU]/mL

## 2022-10-26 ENCOUNTER — Telehealth: Payer: Self-pay

## 2022-10-26 NOTE — Telephone Encounter (Addendum)
-----   Message from Darliss Cheney, MD sent at 10/22/2022  8:16 AM EST ----- Weekly to 0 with precautions  Left message and to return call back for results and f/u.    Frances Nickels  10/26/22

## 2022-10-27 ENCOUNTER — Encounter: Payer: Self-pay | Admitting: Obstetrics & Gynecology

## 2022-10-29 NOTE — Telephone Encounter (Signed)
Called patient with results and recommendation. Scheduled patient for next 3 weekly beta hcg visits (03/04, 03/11, 03/18). Pt verbalized understanding and denied further questions.

## 2022-11-01 ENCOUNTER — Other Ambulatory Visit: Payer: Self-pay

## 2022-11-01 ENCOUNTER — Other Ambulatory Visit: Payer: Medicaid Other

## 2022-11-01 DIAGNOSIS — O009 Unspecified ectopic pregnancy without intrauterine pregnancy: Secondary | ICD-10-CM

## 2022-11-02 LAB — BETA HCG QUANT (REF LAB): hCG Quant: 98 m[IU]/mL

## 2022-11-08 ENCOUNTER — Other Ambulatory Visit: Payer: Self-pay

## 2022-11-08 ENCOUNTER — Other Ambulatory Visit: Payer: Medicaid Other

## 2022-11-08 DIAGNOSIS — O009 Unspecified ectopic pregnancy without intrauterine pregnancy: Secondary | ICD-10-CM

## 2022-11-09 LAB — BETA HCG QUANT (REF LAB): hCG Quant: 11 m[IU]/mL

## 2022-11-11 ENCOUNTER — Telehealth: Payer: Self-pay

## 2022-11-11 NOTE — Telephone Encounter (Addendum)
-----   Message from Darliss Cheney, MD sent at 11/09/2022  3:02 PM EDT ----- Lab visit in 1 week for bhcg for ectopic  Pt notified of results.  Verified pt's lab appt scheduled on 11/15/22 for non stat beta.  Pt verbalized understanding with no further questions.   Frances Nickels  11/11/22

## 2022-11-15 ENCOUNTER — Other Ambulatory Visit: Payer: Medicaid Other

## 2022-11-15 DIAGNOSIS — O009 Unspecified ectopic pregnancy without intrauterine pregnancy: Secondary | ICD-10-CM

## 2022-11-16 LAB — BETA HCG QUANT (REF LAB): hCG Quant: 3 m[IU]/mL

## 2022-12-09 ENCOUNTER — Telehealth: Payer: Self-pay | Admitting: Oncology

## 2022-12-09 NOTE — Telephone Encounter (Signed)
Called and faxed referral to referral to the [facility].

## 2023-01-10 ENCOUNTER — Encounter: Payer: Self-pay | Admitting: Obstetrics and Gynecology

## 2023-01-13 ENCOUNTER — Other Ambulatory Visit: Payer: Self-pay

## 2023-01-13 ENCOUNTER — Telehealth (HOSPITAL_BASED_OUTPATIENT_CLINIC_OR_DEPARTMENT_OTHER): Payer: Self-pay | Admitting: *Deleted

## 2023-01-13 ENCOUNTER — Other Ambulatory Visit: Payer: Self-pay | Admitting: Obstetrics and Gynecology

## 2023-01-13 NOTE — Telephone Encounter (Signed)
LMOVM for pt to call office to set up appt.

## 2023-02-16 ENCOUNTER — Ambulatory Visit (HOSPITAL_BASED_OUTPATIENT_CLINIC_OR_DEPARTMENT_OTHER): Payer: Medicaid Other | Admitting: Obstetrics & Gynecology

## 2023-02-16 ENCOUNTER — Other Ambulatory Visit (HOSPITAL_BASED_OUTPATIENT_CLINIC_OR_DEPARTMENT_OTHER): Payer: Medicaid Other

## 2023-06-17 ENCOUNTER — Telehealth: Payer: Self-pay | Admitting: Oncology

## 2023-06-17 NOTE — Telephone Encounter (Signed)
Left a message for [facility] and requested a return call.

## 2023-06-17 NOTE — Telephone Encounter (Signed)
[facility] and asked if there was any follow up for patient's abnormal pap result.  The office staff said that they could not find any notes in their system saying that patient needed to follow up with their clinic.  They think the patient was notified of the results and was supposed to follow up with her Gyn doctor. An inbasket was also sent to Eula Flax, FNP through [facility].

## 2023-06-25 ENCOUNTER — Ambulatory Visit (HOSPITAL_COMMUNITY)
Admission: EM | Admit: 2023-06-25 | Discharge: 2023-06-25 | Disposition: A | Discharge status: Home / Self Care | Payer: Medicaid Other | Attending: Internal Medicine | Admitting: Internal Medicine

## 2023-06-25 ENCOUNTER — Encounter (HOSPITAL_COMMUNITY): Payer: Self-pay

## 2023-06-25 ENCOUNTER — Ambulatory Visit (HOSPITAL_COMMUNITY): Payer: Medicaid Other

## 2023-06-25 DIAGNOSIS — K5900 Constipation, unspecified: Secondary | ICD-10-CM

## 2023-06-25 DIAGNOSIS — R197 Diarrhea, unspecified: Secondary | ICD-10-CM

## 2023-06-25 DIAGNOSIS — K219 Gastro-esophageal reflux disease without esophagitis: Secondary | ICD-10-CM

## 2023-06-25 LAB — POCT URINE PREGNANCY: Preg Test, Ur: NEGATIVE

## 2023-06-25 LAB — POCT URINALYSIS DIP (MANUAL ENTRY)
Bilirubin, UA: NEGATIVE
Blood, UA: NEGATIVE
Glucose, UA: NEGATIVE mg/dL
Ketones, POC UA: NEGATIVE mg/dL
Leukocytes, UA: NEGATIVE
Nitrite, UA: NEGATIVE
Protein Ur, POC: NEGATIVE mg/dL
Spec Grav, UA: 1.02 (ref 1.010–1.025)
Urobilinogen, UA: 0.2 U/dL
pH, UA: 6 (ref 5.0–8.0)

## 2023-06-25 MED ORDER — LIDOCAINE VISCOUS HCL 2 % MT SOLN
OROMUCOSAL | Status: AC
  Filled 2023-06-25: qty 15

## 2023-06-25 MED ORDER — ALUM & MAG HYDROXIDE-SIMETH 200-200-20 MG/5ML PO SUSP
30.0000 mL | Freq: Once | ORAL | Status: AC
  Administered 2023-06-25: 30 mL via ORAL

## 2023-06-25 MED ORDER — ALUM & MAG HYDROXIDE-SIMETH 200-200-20 MG/5ML PO SUSP
ORAL | Status: AC
  Filled 2023-06-25: qty 30

## 2023-06-25 MED ORDER — LIDOCAINE VISCOUS HCL 2 % MT SOLN
15.0000 mL | Freq: Once | OROMUCOSAL | Status: AC
  Administered 2023-06-25: 15 mL via OROMUCOSAL

## 2023-06-25 MED ORDER — GLYCERIN (ADULT) 2 G RE SUPP: 1.0000 | RECTAL | 0 refills | Status: AC | PRN

## 2023-06-25 MED ORDER — OMEPRAZOLE 20 MG PO CPDR: 20.0000 mg | DELAYED_RELEASE_CAPSULE | Freq: Every day | ORAL | 0 refills | Status: AC

## 2023-06-25 NOTE — ED Triage Notes (Addendum)
STOMACH/BACK PAIN ongoing since January. Started with taking methotrexate causing gas. Patient had to change her diet. A few weeks ago woke up with back pain that is getting more consistent. Used to only happen at night.   Has been having trouble with bowel movements. Took a laxative with no relief.   Epigastric pain going around to the right side of the back.  Patient having nausea, night sweats, states she feels "movement", loose stools when she can have a BM. Patient has been taking a lot of Tylenol and having frequent urination.

## 2023-06-25 NOTE — Discharge Instructions (Addendum)
Please use the suppositories once daily as needed for constipation.  You can also consider Colace, this is available over-the-counter and will help soften your stool.  Ensure you are drinking at least 64 ounces of water and eating plenty of fiber as well.  You can consider an over-the-counter fiber gummy.  Take the omeprazole daily for the next month and follow-up with your primary care provider regarding further management of your acid reflux.  I have put in an order for stool sample, you can give this today or drop it off within the next few days.  This will test for bacteria and viruses in the stool.  Seek immediate care if you develop sudden severe abdominal pain, high fevers, or any new symptoms at the nearest emergency department.

## 2023-06-25 NOTE — ED Provider Notes (Signed)
[facility]    CSN: [id number] Arrival date & time: 06/25/23  1239      History   Chief Complaint Chief Complaint  Patient presents with   Abdominal Pain   Back Pain    HPI Ana Watson is a 32 y.o. female.   Patient presents to clinic for abdominal pain, back pain and constipation.  Reports she has been struggling with constipation since she took methotrexate for an ectopic pregnancy in January.  Also reports that she knows her abdominal and back pain are due to acid reflux.  She has been taking Tums the past few days, as her abdominal pain has been work over the past week.  Abdominal and back pain will wake her up in the middle of the night, she will be up for a few hours and then the pain will gradually resolved.  Reports she has identified triggers for her acid reflux such as drinking coffee, eating pizza and chocolate.  She did have a bowel movement yesterday, reports it was a little bit of diarrhea.  She has been having intermittent diarrhea since January.  She also witnessed her abdominal area moving last night, and is concerned that she may have a parasite.  Denies any nausea, vomiting or fevers.  No dysuria.  She is concerned that maybe she is pregnant, and that is why her stomach was moving.  She took 2 Tylenol prior to arrival.   The history is provided by the patient and medical records.  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 Authorizing Provider  fluticasone (FLONASE) 50 MCG/ACT nasal spray  05/19/21  Yes [provider]  glycerin adult 2 g suppository Place 1 suppository rectally as needed for constipation. 06/25/23  Yes Rinaldo Ratel, [place] N, FNP  omeprazole (PRILOSEC) 20 MG capsule Take 1 capsule (20 mg total) by mouth daily. 06/25/23 08/24/23 Yes Addalynn Kumari, [place] N, FNP    Family History Family History  Problem Relation Age of Onset   Lung cancer Maternal Uncle    Colon cancer Neg Hx    Breast cancer Neg Hx    Ovarian cancer Neg Hx    Endometrial  cancer Neg Hx    Prostate cancer Neg Hx    Pancreatic cancer Neg Hx     Social History Social History   Tobacco Use   Smoking status: Every Day    Current packs/day: 0.50    Average packs/day: 0.5 packs/day for 6.0 years (3.0 ttl pk-yrs)    Types: Cigarettes   Smokeless tobacco: Never  Vaping Use   Vaping status: Never Used  Substance Use Topics   Alcohol use: Not Currently    Comment: occasional   Drug use: Yes    Types: Marijuana     Allergies   Percocet [oxycodone-acetaminophen]   Review of Systems Review of Systems  Constitutional:  Negative for fever.  HENT:  Negative for congestion.   Respiratory:  Negative for cough.   Gastrointestinal:  Positive for abdominal pain, constipation, diarrhea and nausea. Negative for vomiting.  Genitourinary:  Negative for dysuria and menstrual problem.  Musculoskeletal:  Positive for back pain.     Physical  Exam Triage Vital Signs ED Triage Vitals  Encounter Vitals Group     BP --      Systolic BP Percentile --      Diastolic BP Percentile --      Pulse Rate 06/25/23 1340 62     Resp 06/25/23 1340 18     Temp 06/25/23 1340 98.3 F (36.8 C)     Temp Source 06/25/23 1340 Oral     SpO2 06/25/23 1340 99 %     Weight 06/25/23 1340 230 lb (104.3 kg)     Height 06/25/23 1340 5\' 4"  (1.626 m)     Head Circumference --      Peak Flow --      Pain Score 06/25/23 1338 10     Pain Loc --      Pain Education --      Exclude from Growth Chart --    No data found.  Updated Vital Signs BP 106/72 (BP Location: Left Wrist)   Pulse 62   Temp 98.3 F (36.8 C) (Oral)   Resp 18   Ht 5\' 4"  (1.626 m)   Wt 230 lb (104.3 kg)   LMP 06/01/2023 (Approximate)   SpO2 99%   Breastfeeding No   BMI 39.48 kg/m   Visual Acuity Right Eye Distance:   Left Eye Distance:   Bilateral Distance:    Right Eye Near:   Left Eye Near:    Bilateral Near:     Physical Exam Vitals and nursing note reviewed.  Constitutional:      Appearance: She is well-developed.  HENT:     Head: Normocephalic and atraumatic.     Mouth/Throat:     Mouth: Mucous membranes are moist.  Cardiovascular:     Rate and Rhythm: Normal rate and regular rhythm.     Heart sounds: Normal heart sounds. No murmur heard. Pulmonary:     Effort: Pulmonary effort is normal. No respiratory distress.  Abdominal:     General: Abdomen is flat. Bowel sounds are normal.     Palpations: Abdomen is soft.     Tenderness: There is no abdominal tenderness. There is no right CVA tenderness or left CVA tenderness.     Hernia: No hernia is present.  Skin:    General: Skin is warm and dry.  Neurological:     General: No focal deficit present.     Mental Status: She is alert and oriented to person, place, and time.  Psychiatric:  Mood and Affect: Mood normal.        Behavior: Behavior normal.      UC Treatments / Results  Labs (all labs  ordered are listed, but only abnormal results are displayed) Labs Reviewed  GASTROINTESTINAL PANEL BY PCR, STOOL (REPLACES STOOL CULTURE)  POCT URINALYSIS DIP (MANUAL ENTRY)  POCT URINE PREGNANCY    EKG   Radiology DG Abd 2 Views  Result Date: 06/25/2023 CLINICAL DATA:  Constipation and abdominal pain EXAM: ABDOMEN - 2 VIEW COMPARISON:  None Available. FINDINGS: The bowel gas pattern is nonobstructed. No dilated loops of bowel. No air-fluid levels identified. There is a mild to moderate amount of retained stool noted within the right colon. No abnormal abdominal or pelvic calcifications. No signs of pneumoperitoneum. Visualized osseous structures appear intact. IMPRESSION: 1. Nonobstructive bowel gas pattern. 2. Mild to moderate amount of retained stool within the right colon. Electronically Signed   By: Signa Kell M.D.   On: 06/25/2023 15:25    Procedures Procedures (including critical care time)  Medications Ordered in UC Medications  alum & mag hydroxide-simeth (MAALOX/MYLANTA) 200-200-20 MG/5ML suspension 30 mL (30 mLs Oral Given 06/25/23 1500)  lidocaine (XYLOCAINE) 2 % viscous mouth solution 15 mL (15 mLs Mouth/Throat Given 06/25/23 1500)    Initial Impression / Assessment and Plan / UC Course  I have reviewed the triage vital signs and the nursing notes.  Pertinent labs & imaging results that were available during my care of the patient were reviewed by me and considered in my medical decision making (see chart for details).  Vitals and triage reviewed, patient is hemodynamically stable.  Abdomen is soft and nontender to palpation with active bowel sounds.  Heart with regular rate and rhythm, lungs are vesicular.  GI cocktail given for heartburn and acid reflux symptoms.  Acid reflux greatly improved with cocktail, will trial daily omeprazole.  Abdominal imaging obtained for constipation for the past 10 months.  Image shows moderate stool but no obstructive gas pattern.   Will trial glycerin suppositories, high-fiber diet and stool softener.  Encouraged follow-up with PCP for further evaluation of gastrointestinal issues.  Has had intermittent diarrhea, patient unable to provide stool sample at this time, will provide with supplies and encouraged drop off of stool tomorrow for testing.   Plan of care, follow-up care and return precautions given, no questions at this time.  Work note provided.     Final Clinical Impressions(s) / UC Diagnoses   Final diagnoses:  Constipation, unspecified constipation type  Gastroesophageal reflux disease without esophagitis  Diarrhea, unspecified type     Discharge Instructions      Please use the suppositories once daily as needed for constipation.  You can also consider Colace, this is available over-the-counter and will help soften your stool.  Ensure you are drinking at least 64 ounces of water and eating plenty of fiber as well.  You can consider an over-the-counter fiber gummy.  Take the omeprazole daily for the next month and follow-up with your primary care provider regarding further management of your acid reflux.  I have put in an order for stool sample, you can give this today or drop it off within the next few days.  This will test for bacteria and viruses in the stool.  Seek immediate care if you develop sudden severe abdominal pain, high fevers, or any new symptoms at the nearest emergency department.       ED Prescriptions     Medication Sig Dispense Auth.  Provider   omeprazole (PRILOSEC) 20 MG capsule Take 1 capsule (20 mg total) by mouth daily. 60 capsule Rinaldo Ratel, [place] N, [place]   glycerin adult 2 g suppository Place 1 suppository rectally as needed for constipation. 12 suppository Rinaldo Ratel, [place] N, [place]      PDMP not reviewed this encounter.   Broox Lonigro, [place] N, [place] 06/25/23 1535

## 2023-06-27 NOTE — ED Notes (Signed)
Patient brought back her stool sample today. Staff turn it in to the lab

## 2023-06-28 LAB — GASTROINTESTINAL PANEL BY PCR, STOOL (REPLACES STOOL CULTURE)

## 2023-09-02 ENCOUNTER — Ambulatory Visit (HOSPITAL_COMMUNITY): Payer: Medicaid Other

## 2024-01-06 ENCOUNTER — Encounter (HOSPITAL_COMMUNITY): Payer: Self-pay

## 2024-01-06 ENCOUNTER — Ambulatory Visit (INDEPENDENT_AMBULATORY_CARE_PROVIDER_SITE_OTHER)

## 2024-01-06 ENCOUNTER — Other Ambulatory Visit: Payer: Self-pay

## 2024-01-06 ENCOUNTER — Ambulatory Visit (HOSPITAL_COMMUNITY)
Admission: EM | Admit: 2024-01-06 | Discharge: 2024-01-06 | Disposition: A | Discharge status: Home / Self Care | Attending: Emergency Medicine | Admitting: Emergency Medicine

## 2024-01-06 DIAGNOSIS — M25572 Pain in left ankle and joints of left foot: Secondary | ICD-10-CM

## 2024-01-06 DIAGNOSIS — M25571 Pain in right ankle and joints of right foot: Secondary | ICD-10-CM

## 2024-01-06 DIAGNOSIS — S93401A Sprain of unspecified ligament of right ankle, initial encounter: Secondary | ICD-10-CM

## 2024-01-06 DIAGNOSIS — M25579 Pain in unspecified ankle and joints of unspecified foot: Secondary | ICD-10-CM | POA: Diagnosis not present

## 2024-01-06 MED ORDER — NAPROXEN 500 MG PO TABS: 500.0000 mg | ORAL_TABLET | Freq: Two times a day (BID) | ORAL | 0 refills | Status: AC
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
## 2024-01-06 NOTE — Discharge Instructions (Signed)
 Take naproxen  twice daily as needed for pain.  Do not take this with other NSAIDs including ibuprofen , Motrin , Advil , Aleve , and Goody powder. You can take 650 mg of Tylenol  every 4-6 hours as needed for breakthrough pain. Apply ice and heat to help with pain and swelling. Wear Ace wrap and use crutches as needed. Follow-up with [facility] if your pain continues. Return here as needed.
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
